# Patient Record
Sex: Female | Born: 1970 | Race: Black or African American | Hispanic: No | Marital: Single | Smoking: Former smoker
Health system: Southern US, Community
[De-identification: ages and names within clinical notes are randomized; demographics above are authoritative.]

## PROBLEM LIST (undated history)

## (undated) DIAGNOSIS — F1490 Cocaine use, unspecified, uncomplicated: Secondary | ICD-10-CM

## (undated) DIAGNOSIS — R569 Unspecified convulsions: Secondary | ICD-10-CM

## (undated) DIAGNOSIS — F3181 Bipolar II disorder: Secondary | ICD-10-CM

## (undated) DIAGNOSIS — T1491XA Suicide attempt, initial encounter: Secondary | ICD-10-CM

## (undated) DIAGNOSIS — F4310 Post-traumatic stress disorder, unspecified: Secondary | ICD-10-CM

## (undated) DIAGNOSIS — F32A Depression, unspecified: Secondary | ICD-10-CM

## (undated) HISTORY — DX: Post-traumatic stress disorder, unspecified: F43.10

## (undated) HISTORY — DX: Cocaine use, unspecified, uncomplicated: F14.90

## (undated) HISTORY — DX: Depression, unspecified: F32.A

---

## 1998-08-22 ENCOUNTER — Emergency Department (HOSPITAL_COMMUNITY): Admission: EM | Admit: 1998-08-22 | Discharge: 1998-08-22 | Payer: Self-pay | Admitting: Emergency Medicine

## 1998-08-22 ENCOUNTER — Encounter: Payer: Self-pay | Admitting: Emergency Medicine

## 1999-08-21 ENCOUNTER — Inpatient Hospital Stay (HOSPITAL_COMMUNITY): Admission: AD | Admit: 1999-08-21 | Discharge: 1999-08-21 | Payer: Self-pay | Admitting: Obstetrics

## 2001-03-19 ENCOUNTER — Encounter: Admission: RE | Admit: 2001-03-19 | Discharge: 2001-03-19 | Payer: Self-pay | Admitting: Family Medicine

## 2001-04-10 ENCOUNTER — Ambulatory Visit (HOSPITAL_COMMUNITY): Admission: RE | Admit: 2001-04-10 | Discharge: 2001-04-10 | Payer: Self-pay | Admitting: Family Medicine

## 2001-04-10 ENCOUNTER — Encounter: Admission: RE | Admit: 2001-04-10 | Discharge: 2001-04-10 | Payer: Self-pay | Admitting: *Deleted

## 2001-04-10 ENCOUNTER — Encounter: Payer: Self-pay | Admitting: *Deleted

## 2001-04-14 ENCOUNTER — Encounter: Admission: RE | Admit: 2001-04-14 | Discharge: 2001-04-14 | Payer: Self-pay | Admitting: Pediatrics

## 2001-04-20 ENCOUNTER — Inpatient Hospital Stay (HOSPITAL_COMMUNITY): Admission: AD | Admit: 2001-04-20 | Discharge: 2001-04-20 | Payer: Self-pay | Admitting: *Deleted

## 2001-05-05 ENCOUNTER — Encounter: Admission: RE | Admit: 2001-05-05 | Discharge: 2001-05-05 | Payer: Self-pay | Admitting: Family Medicine

## 2003-02-16 ENCOUNTER — Emergency Department (HOSPITAL_COMMUNITY): Admission: EM | Admit: 2003-02-16 | Discharge: 2003-02-17 | Payer: Self-pay | Admitting: *Deleted

## 2003-08-08 ENCOUNTER — Inpatient Hospital Stay (HOSPITAL_COMMUNITY): Admission: EM | Admit: 2003-08-08 | Discharge: 2003-08-16 | Payer: Self-pay | Admitting: Psychiatry

## 2003-10-07 ENCOUNTER — Emergency Department (HOSPITAL_COMMUNITY): Admission: EM | Admit: 2003-10-07 | Discharge: 2003-10-07 | Payer: Self-pay | Admitting: Emergency Medicine

## 2004-02-21 ENCOUNTER — Emergency Department (HOSPITAL_COMMUNITY): Admission: EM | Admit: 2004-02-21 | Discharge: 2004-02-21 | Payer: Self-pay | Admitting: Emergency Medicine

## 2005-01-08 ENCOUNTER — Emergency Department (HOSPITAL_COMMUNITY): Admission: EM | Admit: 2005-01-08 | Discharge: 2005-01-08 | Payer: Self-pay | Admitting: Emergency Medicine

## 2006-04-15 ENCOUNTER — Emergency Department (HOSPITAL_COMMUNITY): Admission: EM | Admit: 2006-04-15 | Discharge: 2006-04-15 | Payer: Self-pay | Admitting: Emergency Medicine

## 2007-08-04 ENCOUNTER — Emergency Department (HOSPITAL_COMMUNITY): Admission: EM | Admit: 2007-08-04 | Discharge: 2007-08-04 | Payer: Self-pay | Admitting: Emergency Medicine

## 2010-10-06 NOTE — H&P (Signed)
NAME:  Sylvia Arellano, Sylvia Arellano                           ACCOUNT NO.:  [id number]   MEDICAL RECORD NO.:  [id number]                   PATIENT TYPE:  IPS   LOCATION:  0306                                 FACILITY:  [facility]   PHYSICIAN:  Jeanice Lim, M.D.              DATE OF BIRTH:  10-Sep-1970   DATE OF ADMISSION:  08/08/2003  DATE OF DISCHARGE:                         PSYCHIATRIC ADMISSION ASSESSMENT   IDENTIFYING INFORMATION:  This is a voluntary admission.  This is a 40-year-  old single black female.  She presented at [facility]  yesterday with a plan to drink bleach or overdose.  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  She states she is using  about $300 a day.  She began sniffing cocaine about four years ago and then  smoking one year ago.  She has had prior treatment at [facility], which was  a facility for pregnant but addicted females.  She was admitted there  December 2003 from [place].  She stayed clean for about eight months  until her mother died in 16-Jan-2003 and she said she was clean one day  and then relapsed after being discharged.  Her first overdose was at age 44  and that is all she remembers at this point.   SOCIAL HISTORY:  She has had about one year of college.  She was employed  Radiographer, therapeutic.  Currently, she has a 59 year old daughter, who lives with her  father, a 58-year-old son, who lives with his father.   FAMILY HISTORY:  She states that her mother had depression and anxiety.   ALCOHOL/DRUG HISTORY:  She denies alcohol, although she does smoke  cigarettes and, of course, has been using crack.   PRIMARY CARE PHYSICIAN:  None.   MEDICAL PROBLEMS:  She has no known problems, although she is quite  concerned about having perhaps caught an STD or HIV.  She would like to be  checked for that.   CURRENT MEDICATIONS:  None.   ALLERGIES:  None.   PHYSICAL EXAMINATION:  GENERAL:  A healthy young, black  female who appeared  her stated age.  HEENT:  Increased ear wax; otherwise was within normal limits.  LUNGS:  Clear.  HEART:  Regular rate and rhythm without murmurs, rubs, or gallops.  ABDOMEN:  Soft with no mass, megaly or tenderness.  MUSCULOSKELETAL:  No clubbing, cyanosis, or edema.  NEURO:  Cranial nerves 2-12 were grossly intact.   MENTAL STATUS EXAM:  Alert and oriented x 3.  She was well-groomed and  casually dressed.  Her speech was not pressured.  Her mood was somewhat  depressed and anxious.  Appropriate to the situation.  Her affect was  congruent.  Her thought processes were clear, rational and goal-oriented.  She was not issuing any signs of paranoia or delusions and none was detected  in her  speech.  Concentration and memory were intact.  Judgment and insight  were intact albeit not good.  Intelligence is average.   DIAGNOSES:   AXIS I:  1. Substance-induced mood disorder.  2. Tobacco use; rule out dependence.   AXIS II:  Deferred.   AXIS III:  Rule out sexually transmitted diseases.   AXIS IV:  Problems with primary support group, problems related to social  environment, occupational problems, housing problem, economic problem and  other psychosocial problems, unresolved grief.   AXIS V:  30.   PLAN:  She is admitted to provide safety, to help her through withdrawal and  to establish antidepressant therapy.  We will also have casemanagement  explore further long-term drug rehab for her.     Vic Ripper, P.A.-C.               Jeanice Lim, M.D.    MD/MEDQ  D:  08/08/2003  T:  08/08/2003  Job:  [id number]

## 2010-10-06 NOTE — Discharge Summary (Signed)
NAME:  Sylvia Arellano, Sylvia Arellano                           ACCOUNT NO.:  [id number]   MEDICAL RECORD NO.:  [id number]                   PATIENT TYPE:  IPS   LOCATION:  0306                                 FACILITY[facility]  [facility]   PHYSICIAN:  Jeanice Lim, M.D.              DATE OF BIRTH:  1970-08-31   DATE OF ADMISSION:  08/08/2003  DATE OF DISCHARGE:  08/16/2003                                 DISCHARGE SUMMARY   IDENTIFYING DATA:  This is a 40 year old, single, African-American female  presenting to [facility] planning to drink bleach  and overdose.  The patient has been prostituting of self for crack, using  $300.00 a day.  She began sniffing cocaine four years ago and smoking one  year ago.  The patient stayed clean for about eight months until her mother  died in 09-03-2004and she relapsed after this.  First overdose at age 68.   MEDICATIONS:  None.   ALLERGIES:  None.   PHYSICAL EXAMINATION:  Within normal limits.  NEUROLOGIC:  Nonfocal.   ROUTINE ADMISSION LABORATORIES:  Within normal limits.   MENTAL STATUS EXAM:  Alert and oriented, well-groomed and casually dressed,  speech not pressured, mood depressed, anxious, thought process goal-  directed, thought content negative for dangerous ideation and psychotic  symptoms.  Cognitively intact. Judgment and insight fair.   ADMITTING DIAGNOSES:   AXIS I:  1. Substance induced mood disorder.  2. Nicotine dependence.   AXIS II:  Deferred.   AXIS III:  Rule out sexually transmitted disease.   AXIS IV:  1. Moderate problems with primary support group and problems related to     social environment.  2. Occupational problems.  3. Housing problems.  4. Economic problems, unresolved in degree.   AXIS V:  30/55 to 60.   The patient was admitted and ordered routine p.r.n. medication and underwent  monitoring.  She was encouraged to participate in individual, group and  milieu therapy.  The patient had a  sexually transmitted disease workup with  chlamydia and gonorrhea, RPR, HIV.  She was started on Wellbutrin for her  antidepressive symptoms and Neurontin as well as Symmetrel for cocaine  cravings.  Seroquel has been used to restore sleep; Cipro used to treat UTI.  The patient reported a positive response to the medication changes.  She  tolerated the medication changes well.  The patient was also detoxed with  Librium and optimized on medications.  The patient reported a gradual slow  positive response to medication changes and reported motivation to be  discharged to a substance abuse treatment program.  She was optimized on  Zyprexa, used Vistaril for p.r.n. anxiety and reported a positive response  to clinical intervention.   CONDITION ON DISCHARGE:  Markedly improved; mood stable; less anxious; less  depressed; motivated to complete residential substance abuse program.   She was given medication education  and discharged on Paxil 20 mg q.a.m.;  Pyridium 100 mg t.i.d. p.r.n.; Vistaril 50 mg two q.8 p.r.n. anxiety;  Wellbutrin XL 150 mg q.a.m.; Symmetrel 100 mg b.i.d.; Neurontin 300 mg  t.i.d. and three q.h.s.; Zyprexa 15 mg q.h.s.; Zyprexa 2.5 q.4 p.r.n.  agitation.  The patient was discharged to follow-up at residential treatment  program.   DISCHARGE DIAGNOSES:   AXIS I:  1. Substance induced mood disorder.  2. Nicotine dependence.   AXIS II:  Deferred.   AXIS III:  Rule out sexually transmitted disease.   AXIS IV:  1. Moderate problems with primary support group and problems related to     social environment.  2. Occupational problems.  3. Housing problems.  4. Economic problems, unresolved in degree.   AXIS V:  Global assessment of functioning of 55-60.                                               Jeanice Lim, M.D.    JEM/MEDQ  D:  09/15/2003  T:  09/17/2003  Job:  [id number]

## 2011-02-12 LAB — URINALYSIS, ROUTINE W REFLEX MICROSCOPIC
Bilirubin Urine: NEGATIVE
Glucose, UA: NEGATIVE
Hgb urine dipstick: NEGATIVE
Ketones, ur: NEGATIVE
Nitrite: NEGATIVE
Protein, ur: NEGATIVE
Specific Gravity, Urine: 1.024
Urobilinogen, UA: 1
pH: 7

## 2011-02-12 LAB — WET PREP, GENITAL
Trich, Wet Prep: NONE SEEN
WBC, Wet Prep HPF POC: NONE SEEN
Yeast Wet Prep HPF POC: NONE SEEN

## 2011-02-12 LAB — GC/CHLAMYDIA PROBE AMP, GENITAL
Chlamydia, DNA Probe: NEGATIVE
GC Probe Amp, Genital: NEGATIVE

## 2011-02-12 LAB — PREGNANCY, URINE: Preg Test, Ur: NEGATIVE

## 2011-02-12 LAB — URINE MICROSCOPIC-ADD ON

## 2012-03-30 ENCOUNTER — Emergency Department (HOSPITAL_COMMUNITY)
Admission: EM | Admit: 2012-03-30 | Discharge: 2012-03-31 | Disposition: A | Discharge status: Home / Self Care | Payer: Self-pay | Attending: Emergency Medicine | Admitting: Emergency Medicine

## 2012-03-30 ENCOUNTER — Encounter (HOSPITAL_COMMUNITY): Payer: Self-pay | Admitting: Emergency Medicine

## 2012-03-30 DIAGNOSIS — T50992A Poisoning by other drugs, medicaments and biological substances, intentional self-harm, initial encounter: Secondary | ICD-10-CM | POA: Insufficient documentation

## 2012-03-30 DIAGNOSIS — F3189 Other bipolar disorder: Secondary | ICD-10-CM | POA: Insufficient documentation

## 2012-03-30 DIAGNOSIS — T50901A Poisoning by unspecified drugs, medicaments and biological substances, accidental (unintentional), initial encounter: Secondary | ICD-10-CM | POA: Insufficient documentation

## 2012-03-30 DIAGNOSIS — Z8669 Personal history of other diseases of the nervous system and sense organs: Secondary | ICD-10-CM | POA: Insufficient documentation

## 2012-03-30 HISTORY — DX: Bipolar II disorder: F31.81

## 2012-03-30 HISTORY — DX: Unspecified convulsions: R56.9

## 2012-03-30 HISTORY — DX: Suicide attempt, initial encounter: T14.91XA

## 2012-03-30 LAB — COMPREHENSIVE METABOLIC PANEL
ALT: 15 U/L (ref 0–35)
AST: 30 U/L (ref 0–37)
Albumin: 3.9 g/dL (ref 3.5–5.2)
Calcium: 9.4 mg/dL (ref 8.4–10.5)
GFR calc Af Amer: 75 mL/min — ABNORMAL LOW (ref >90)
Sodium: 139 mEq/L (ref 135–145)
Total Protein: 8.6 g/dL — ABNORMAL HIGH (ref 6.0–8.3)

## 2012-03-30 LAB — SALICYLATE LEVEL: Salicylate Lvl: <2 mg/dL — ABNORMAL LOW (ref 2.8–20.0)

## 2012-03-30 LAB — CBC
MCH: 26.8 pg (ref 26.0–34.0)
MCHC: 34.1 g/dL (ref 30.0–36.0)
Platelets: 291 10*3/uL (ref 150–400)
RDW: 15.8 % — ABNORMAL HIGH (ref 11.5–15.5)

## 2012-03-30 LAB — RAPID URINE DRUG SCREEN, HOSP PERFORMED
Amphetamines: POSITIVE — AB
Benzodiazepines: NOT DETECTED
Opiates: NOT DETECTED

## 2012-03-30 MED ORDER — NICOTINE 21 MG/24HR TD PT24: 21.0000 mg | MEDICATED_PATCH | Freq: Every day | TRANSDERMAL | Status: DC

## 2012-03-30 MED ORDER — ALUM & MAG HYDROXIDE-SIMETH 200-200-20 MG/5ML PO SUSP: 30.0000 mL | ORAL | Status: DC | PRN

## 2012-03-30 MED ORDER — ONDANSETRON HCL 4 MG PO TABS: 4.0000 mg | ORAL_TABLET | Freq: Three times a day (TID) | ORAL | Status: DC | PRN

## 2012-03-30 MED ORDER — ZOLPIDEM TARTRATE 5 MG PO TABS: 5.0000 mg | ORAL_TABLET | Freq: Every evening | ORAL | Status: DC | PRN

## 2012-03-30 NOTE — ED Provider Notes (Signed)
History     CSN: [id number]  Arrival date & time 03/30/12  2127   None     Chief Complaint  Patient presents with  . Drug Overdose    (Consider location/radiation/quality/duration/timing/severity/associated sxs/prior treatment) HPI Patient overdosed with multiple medications unknown time today. She overdosed as she has had stresses with family members. She presently feels confused. No treatment prior to coming here the patient was evaluated at [facility] emergency department sent here for further evaluation Past Medical History  Diagnosis Date  . Bipolar 2 disorder   . Suicide attempt   . Seizures     No past surgical history on file.  No family history on file.  History  Substance Use Topics  . Smoking status: Not on file  . Smokeless tobacco: Not on file  . Alcohol Use: Yes   positive smoker occasional alcohol admits to cocaine use  OB History    Grav Para Term Preterm Abortions TAB SAB Ect Mult Living                  Review of Systems  Psychiatric/Behavioral: Positive for confusion and dysphoric mood.  All other systems reviewed and are negative.    Allergies  Review of patient's allergies indicates no known allergies.  Home Medications  No current outpatient prescriptions on file.  BP 120/98  Pulse 95  Temp 98.9 F (37.2 C) (Oral)  Resp 18  SpO2 100%  Physical Exam  Nursing note and vitals reviewed. Constitutional: She is oriented to person, place, and time. She appears well-developed and well-nourished.  HENT:  Head: Normocephalic and atraumatic.  Eyes: Conjunctivae normal are normal. Pupils are equal, round, and reactive to light.  Neck: Neck supple. No tracheal deviation present. No thyromegaly present.  Cardiovascular: Normal rate and regular rhythm.   No murmur heard. Pulmonary/Chest: Effort normal and breath sounds normal.  Abdominal: Soft. Bowel sounds are normal. She exhibits no distension. There is no tenderness.  Musculoskeletal:  Normal range of motion. She exhibits no edema and no tenderness.  Neurological: She is alert and oriented to person, place, and time. Coordination normal.       Gait normal  Skin: Skin is warm and dry. No rash noted.  Psychiatric: She has a normal mood and affect.    ED Course  Procedures (including critical care time)  Labs Reviewed  CBC - Abnormal; Notable for the following:    Hemoglobin 11.8 (*)     HCT 34.6 (*)     RDW 15.8 (*)     All other components within normal limits  URINE RAPID DRUG SCREEN (HOSP PERFORMED) - Abnormal; Notable for the following:    Cocaine POSITIVE (*)     Amphetamines POSITIVE (*)     All other components within normal limits  POCT PREGNANCY, URINE  COMPREHENSIVE METABOLIC PANEL  ETHANOL  ACETAMINOPHEN LEVEL  SALICYLATE LEVEL   No results found.   No diagnosis found.   Date: 03/30/2012  Rate: 90  Rhythm: normal sinus rhythm  QRS Axis: normal  Intervals: normal  ST/T Wave abnormalities: nonspecific T wave changes  Conduction Disutrbances:none  Narrative Interpretation:   Old EKG Reviewed: No significant change or Oct 07 2003 as interpreted by me Results for orders placed during the hospital encounter of 03/30/12  CBC      Component Value Range   WBC 6.6  4.0 - 10.5 K/uL   RBC 4.41  3.87 - 5.11 MIL/uL   Hemoglobin 11.8 (*) 12.0 - 15.0 g/dL  HCT 34.6 (*) 36.0 - 46.0 %   MCV 78.5  78.0 - 100.0 fL   MCH 26.8  26.0 - 34.0 pg   MCHC 34.1  30.0 - 36.0 g/dL   RDW 16.1 (*) 09.6 - 04.5 %   Platelets 291  150 - 400 K/uL  COMPREHENSIVE METABOLIC PANEL      Component Value Range   Sodium 139  135 - 145 mEq/L   Potassium 4.2  3.5 - 5.1 mEq/L   Chloride 102  96 - 112 mEq/L   CO2 23  19 - 32 mEq/L   Glucose, Bld 91  70 - 99 mg/dL   BUN 15  6 - 23 mg/dL   Creatinine, Ser 4.09  0.50 - 1.10 mg/dL   Calcium 9.4  8.4 - 81.1 mg/dL   Total Protein 8.6 (*) 6.0 - 8.3 g/dL   Albumin 3.9  3.5 - 5.2 g/dL   AST 30  0 - 37 U/L   ALT 15  0 - 35 U/L    Alkaline Phosphatase 73  39 - 117 U/L   Total Bilirubin 0.2 (*) 0.3 - 1.2 mg/dL   GFR calc non Af Amer 65 (*) >90 mL/min   GFR calc Af Amer 75 (*) >90 mL/min  ETHANOL      Component Value Range   Alcohol, Ethyl (B) 15 (*) 0 - 11 mg/dL  ACETAMINOPHEN LEVEL      Component Value Range   Acetaminophen (Tylenol), Serum <15.0  10 - 30 ug/mL  SALICYLATE LEVEL      Component Value Range   Salicylate Lvl <2.0 (*) 2.8 - 20.0 mg/dL  URINE RAPID DRUG SCREEN (HOSP PERFORMED)      Component Value Range   Opiates NONE DETECTED  NONE DETECTED   Cocaine POSITIVE (*) NONE DETECTED   Benzodiazepines NONE DETECTED  NONE DETECTED   Amphetamines POSITIVE (*) NONE DETECTED   Tetrahydrocannabinol NONE DETECTED  NONE DETECTED   Barbiturates NONE DETECTED  NONE DETECTED  POCT PREGNANCY, URINE      Component Value Range   Preg Test, Ur NEGATIVE  NEGATIVE   No results found.   MDM  Plan psychiatric evaluation. Patient cooperative and agrees to psychiatric evaluation and psychiatric hospitalization at present Diagnosis drug overdose        Doug Sou, MD 03/30/12 2350

## 2012-03-30 NOTE — ED Notes (Signed)
ZOX:[id number]<AV> Expected date:03/30/12<BR> Expected time: 8:57 PM<BR> Means of arrival:Ambulance<BR> Comments:<BR> RM 6: OD pills, presented to [facility] w/ AMS

## 2012-03-30 NOTE — ED Notes (Addendum)
Pt comes from [place] where she tried to check herself in for taking a bottle Trazadone, Prozac, a handful of Neurontin, and 'another med.' Doesn't admit to trying to harm herself but made statements to EMS like "i just dont want to feel this way anymore.' Hx of bipolar disorder. Goes to outpt at [facility].

## 2012-03-31 NOTE — ED Provider Notes (Signed)
2:27 AM PT awake and alert, not suicidal. Telepsych consult obtained and reviewed dated 03-31-12.  Dr Jacky Kindle recommends OK for discharge home. He recommends outpatient drug and alcohol program and also [facility] follow up. ACT provided referrals.   Sunnie Nielsen, MD 03/31/12 [phone]

## 2014-07-09 ENCOUNTER — Emergency Department (HOSPITAL_COMMUNITY): Payer: Self-pay

## 2014-07-09 ENCOUNTER — Inpatient Hospital Stay (HOSPITAL_COMMUNITY): Payer: Self-pay

## 2014-07-09 ENCOUNTER — Inpatient Hospital Stay (HOSPITAL_COMMUNITY)
Admission: EM | Admit: 2014-07-09 | Discharge: 2014-07-12 | DRG: 078 | Discharge status: Against Medical Advice | Payer: Self-pay | Attending: Internal Medicine | Admitting: Internal Medicine

## 2014-07-09 ENCOUNTER — Encounter (HOSPITAL_COMMUNITY): Payer: Self-pay | Admitting: Emergency Medicine

## 2014-07-09 ENCOUNTER — Observation Stay (HOSPITAL_COMMUNITY): Payer: Self-pay

## 2014-07-09 DIAGNOSIS — G459 Transient cerebral ischemic attack, unspecified: Secondary | ICD-10-CM

## 2014-07-09 DIAGNOSIS — I160 Hypertensive urgency: Secondary | ICD-10-CM | POA: Diagnosis present

## 2014-07-09 DIAGNOSIS — Y900 Blood alcohol level of less than 20 mg/100 ml: Secondary | ICD-10-CM | POA: Diagnosis present

## 2014-07-09 DIAGNOSIS — Z87898 Personal history of other specified conditions: Secondary | ICD-10-CM

## 2014-07-09 DIAGNOSIS — F1410 Cocaine abuse, uncomplicated: Secondary | ICD-10-CM

## 2014-07-09 DIAGNOSIS — R55 Syncope and collapse: Secondary | ICD-10-CM | POA: Diagnosis present

## 2014-07-09 DIAGNOSIS — G40909 Epilepsy, unspecified, not intractable, without status epilepticus: Secondary | ICD-10-CM | POA: Diagnosis present

## 2014-07-09 DIAGNOSIS — R059 Cough, unspecified: Secondary | ICD-10-CM

## 2014-07-09 DIAGNOSIS — I959 Hypotension, unspecified: Secondary | ICD-10-CM | POA: Diagnosis not present

## 2014-07-09 DIAGNOSIS — F17210 Nicotine dependence, cigarettes, uncomplicated: Secondary | ICD-10-CM | POA: Diagnosis present

## 2014-07-09 DIAGNOSIS — F1490 Cocaine use, unspecified, uncomplicated: Secondary | ICD-10-CM | POA: Insufficient documentation

## 2014-07-09 DIAGNOSIS — E876 Hypokalemia: Secondary | ICD-10-CM | POA: Diagnosis present

## 2014-07-09 DIAGNOSIS — F1010 Alcohol abuse, uncomplicated: Secondary | ICD-10-CM | POA: Diagnosis present

## 2014-07-09 DIAGNOSIS — R05 Cough: Secondary | ICD-10-CM

## 2014-07-09 DIAGNOSIS — F1091 Alcohol use, unspecified, in remission: Secondary | ICD-10-CM | POA: Diagnosis present

## 2014-07-09 DIAGNOSIS — Z79899 Other long term (current) drug therapy: Secondary | ICD-10-CM

## 2014-07-09 DIAGNOSIS — R4701 Aphasia: Secondary | ICD-10-CM | POA: Diagnosis present

## 2014-07-09 DIAGNOSIS — F3181 Bipolar II disorder: Secondary | ICD-10-CM | POA: Diagnosis present

## 2014-07-09 DIAGNOSIS — I639 Cerebral infarction, unspecified: Secondary | ICD-10-CM | POA: Insufficient documentation

## 2014-07-09 DIAGNOSIS — F17201 Nicotine dependence, unspecified, in remission: Secondary | ICD-10-CM | POA: Diagnosis present

## 2014-07-09 DIAGNOSIS — I10 Essential (primary) hypertension: Secondary | ICD-10-CM | POA: Diagnosis present

## 2014-07-09 DIAGNOSIS — I674 Hypertensive encephalopathy: Principal | ICD-10-CM | POA: Insufficient documentation

## 2014-07-09 DIAGNOSIS — Z8669 Personal history of other diseases of the nervous system and sense organs: Secondary | ICD-10-CM

## 2014-07-09 DIAGNOSIS — F419 Anxiety disorder, unspecified: Secondary | ICD-10-CM | POA: Diagnosis present

## 2014-07-09 DIAGNOSIS — Z720 Tobacco use: Secondary | ICD-10-CM | POA: Diagnosis present

## 2014-07-09 HISTORY — DX: Cocaine abuse, uncomplicated: F14.10

## 2014-07-09 LAB — I-STAT CHEM 8, ED
BUN: 10 mg/dL (ref 6–23)
CALCIUM ION: 1.05 mmol/L — AB (ref 1.12–1.23)
CHLORIDE: 100 mmol/L (ref 96–112)
CREATININE: 1 mg/dL (ref 0.50–1.10)
Glucose, Bld: 175 mg/dL — ABNORMAL HIGH (ref 70–99)
HCT: 41 % (ref 36.0–46.0)
Hemoglobin: 13.9 g/dL (ref 12.0–15.0)
Potassium: 3.4 mmol/L — ABNORMAL LOW (ref 3.5–5.1)
SODIUM: 135 mmol/L (ref 135–145)
TCO2: 14 mmol/L (ref 0–100)

## 2014-07-09 LAB — I-STAT TROPONIN, ED: TROPONIN I, POC: 0 ng/mL (ref 0.00–0.08)

## 2014-07-09 LAB — COMPREHENSIVE METABOLIC PANEL
ALK PHOS: 71 U/L (ref 39–117)
ALT: 20 U/L (ref 0–35)
ANION GAP: 17 — AB (ref 5–15)
AST: 40 U/L — ABNORMAL HIGH (ref 0–37)
Albumin: 4.4 g/dL (ref 3.5–5.2)
BILIRUBIN TOTAL: 0.6 mg/dL (ref 0.3–1.2)
BUN: 10 mg/dL (ref 6–23)
CALCIUM: 9.2 mg/dL (ref 8.4–10.5)
CO2: 18 mmol/L — AB (ref 19–32)
Chloride: 99 mmol/L (ref 96–112)
Creatinine, Ser: 1.13 mg/dL — ABNORMAL HIGH (ref 0.50–1.10)
GFR calc Af Amer: 69 mL/min — ABNORMAL LOW (ref >90)
GFR calc non Af Amer: 60 mL/min — ABNORMAL LOW (ref >90)
GLUCOSE: 171 mg/dL — AB (ref 70–99)
Potassium: 3.4 mmol/L — ABNORMAL LOW (ref 3.5–5.1)
SODIUM: 134 mmol/L — AB (ref 135–145)
Total Protein: 8.2 g/dL (ref 6.0–8.3)

## 2014-07-09 LAB — DIFFERENTIAL
BASOS PCT: 1 % (ref 0–1)
Basophils Absolute: 0 10*3/uL (ref 0.0–0.1)
EOS ABS: 0 10*3/uL (ref 0.0–0.7)
EOS PCT: 0 % (ref 0–5)
Lymphocytes Relative: 46 % (ref 12–46)
Lymphs Abs: 2.5 10*3/uL (ref 0.7–4.0)
MONO ABS: 0.7 10*3/uL (ref 0.1–1.0)
MONOS PCT: 12 % (ref 3–12)
Neutro Abs: 2.3 10*3/uL (ref 1.7–7.7)
Neutrophils Relative %: 41 % — ABNORMAL LOW (ref 43–77)

## 2014-07-09 LAB — URINALYSIS, ROUTINE W REFLEX MICROSCOPIC
Bilirubin Urine: NEGATIVE
Glucose, UA: NEGATIVE mg/dL
HGB URINE DIPSTICK: NEGATIVE
Ketones, ur: NEGATIVE mg/dL
Leukocytes, UA: NEGATIVE
Nitrite: NEGATIVE
PROTEIN: 30 mg/dL — AB
SPECIFIC GRAVITY, URINE: 1.019 (ref 1.005–1.030)
UROBILINOGEN UA: 0.2 mg/dL (ref 0.0–1.0)
pH: 6 (ref 5.0–8.0)

## 2014-07-09 LAB — APTT: APTT: 30 s (ref 24–37)

## 2014-07-09 LAB — RAPID URINE DRUG SCREEN, HOSP PERFORMED
Amphetamines: NOT DETECTED
BARBITURATES: NOT DETECTED
Benzodiazepines: NOT DETECTED
Cocaine: POSITIVE — AB
Opiates: NOT DETECTED
TETRAHYDROCANNABINOL: NOT DETECTED

## 2014-07-09 LAB — PROTIME-INR
INR: 1.2 (ref 0.00–1.49)
PROTHROMBIN TIME: 15.3 s — AB (ref 11.6–15.2)

## 2014-07-09 LAB — CBC
HEMATOCRIT: 35.5 % — AB (ref 36.0–46.0)
HEMOGLOBIN: 11.8 g/dL — AB (ref 12.0–15.0)
MCH: 27.3 pg (ref 26.0–34.0)
MCHC: 33.2 g/dL (ref 30.0–36.0)
MCV: 82.2 fL (ref 78.0–100.0)
Platelets: 282 10*3/uL (ref 150–400)
RBC: 4.32 MIL/uL (ref 3.87–5.11)
RDW: 15.4 % (ref 11.5–15.5)
WBC: 5.6 10*3/uL (ref 4.0–10.5)

## 2014-07-09 LAB — URINE MICROSCOPIC-ADD ON

## 2014-07-09 LAB — ETHANOL: Alcohol, Ethyl (B): 11 mg/dL — ABNORMAL HIGH (ref 0–9)

## 2014-07-09 MED ORDER — THIAMINE HCL 100 MG/ML IJ SOLN
100.0000 mg | Freq: Every day | INTRAMUSCULAR | Status: DC
  Filled 2014-07-09: qty 2

## 2014-07-09 MED ORDER — POTASSIUM CHLORIDE CRYS ER 20 MEQ PO TBCR
40.0000 meq | EXTENDED_RELEASE_TABLET | Freq: Once | ORAL | Status: AC
  Administered 2014-07-09: 40 meq via ORAL
  Filled 2014-07-09: qty 2

## 2014-07-09 MED ORDER — FOLIC ACID 1 MG PO TABS
1.0000 mg | ORAL_TABLET | Freq: Every day | ORAL | Status: DC
  Administered 2014-07-09 – 2014-07-12 (×4): 1 mg via ORAL
  Filled 2014-07-09 (×4): qty 1

## 2014-07-09 MED ORDER — LORAZEPAM 1 MG PO TABS: 0.0000 mg | ORAL_TABLET | Freq: Two times a day (BID) | ORAL | Status: DC

## 2014-07-09 MED ORDER — SENNOSIDES-DOCUSATE SODIUM 8.6-50 MG PO TABS: 1.0000 | ORAL_TABLET | Freq: Every evening | ORAL | Status: DC | PRN

## 2014-07-09 MED ORDER — ASPIRIN 325 MG PO TABS
325.0000 mg | ORAL_TABLET | Freq: Every day | ORAL | Status: DC
  Administered 2014-07-09 – 2014-07-12 (×4): 325 mg via ORAL
  Filled 2014-07-09 (×4): qty 1

## 2014-07-09 MED ORDER — ATORVASTATIN CALCIUM 20 MG PO TABS
20.0000 mg | ORAL_TABLET | Freq: Every day | ORAL | Status: DC
  Administered 2014-07-09 – 2014-07-11 (×3): 20 mg via ORAL
  Filled 2014-07-09 (×2): qty 1
  Filled 2014-07-09 (×2): qty 2
  Filled 2014-07-09 (×2): qty 1
  Filled 2014-07-09: qty 2

## 2014-07-09 MED ORDER — LORAZEPAM 1 MG PO TABS
0.0000 mg | ORAL_TABLET | Freq: Four times a day (QID) | ORAL | Status: AC
  Administered 2014-07-11: 1 mg via ORAL
  Filled 2014-07-09: qty 1

## 2014-07-09 MED ORDER — VITAMIN B-1 100 MG PO TABS
100.0000 mg | ORAL_TABLET | Freq: Every day | ORAL | Status: DC
  Administered 2014-07-09 – 2014-07-12 (×4): 100 mg via ORAL
  Filled 2014-07-09 (×4): qty 1

## 2014-07-09 MED ORDER — LORAZEPAM 1 MG PO TABS: 1.0000 mg | ORAL_TABLET | Freq: Four times a day (QID) | ORAL | Status: DC | PRN

## 2014-07-09 MED ORDER — ASPIRIN 300 MG RE SUPP: 300.0000 mg | Freq: Every day | RECTAL | Status: DC

## 2014-07-09 MED ORDER — HYDRALAZINE HCL 20 MG/ML IJ SOLN: 10.0000 mg | Freq: Four times a day (QID) | INTRAMUSCULAR | Status: DC | PRN

## 2014-07-09 MED ORDER — ADULT MULTIVITAMIN W/MINERALS CH
1.0000 | ORAL_TABLET | Freq: Every day | ORAL | Status: DC
  Administered 2014-07-09 – 2014-07-12 (×4): 1 via ORAL
  Filled 2014-07-09 (×4): qty 1

## 2014-07-09 MED ORDER — LORAZEPAM 2 MG/ML IJ SOLN: 1.0000 mg | Freq: Four times a day (QID) | INTRAMUSCULAR | Status: DC | PRN

## 2014-07-09 MED ORDER — HEPARIN SODIUM (PORCINE) 5000 UNIT/ML IJ SOLN
5000.0000 [IU] | Freq: Three times a day (TID) | INTRAMUSCULAR | Status: DC
  Administered 2014-07-09 – 2014-07-12 (×8): 5000 [IU] via SUBCUTANEOUS
  Filled 2014-07-09 (×8): qty 1

## 2014-07-09 MED ORDER — STROKE: EARLY STAGES OF RECOVERY BOOK
Freq: Once | Status: AC
  Administered 2014-07-09: 19:00:00

## 2014-07-09 NOTE — H&P (Signed)
[facility] History and Physical                                                                                    Sylvia Arellano, is a 44 y.o. female  MRN: [id number]   DOB - Apr 19, 1971  Admit Date - 07/09/2014  Outpatient Primary MD for the patient is No primary care provider on file.  With History of -  Past Medical History  Diagnosis Date  . Bipolar 2 disorder   . Suicide attempt   . Seizures       History reviewed. No pertinent past surgical history.  in for   Chief Complaint  Patient presents with  . Code Stroke     HPI Sylvia Arellano  is a 44 y.o. female, the past medical history of bipolar affective disorder as well as seizures. Brought to the ER due to acute complaints of new onset slurred speech associated with dizziness and apparent confusion. According to the documentation from the neurologist the patient developed the symptoms around 11:15 AM today. Patient has no prior history of stroke or TIA and has not required antiplatelet therapy for any diagnoses. When she presented to the emergency department her blood pressure was markedly elevated at 280/120. Her blood pressure improved after arrival to the ER to 140/98 without any intervention or medication administration. CT of the head revealed no acute intra-or no abnormality. Her NIH stroke score was 0. Mother time the neurologist to evaluate the patient her mental status changes had resolved including her slurred speech and she was noted to have no focal weakness or numbness. She later admitted to the EDP that she had been using cocaine today prior to coming to the ER. She also admitted to daily use of alcohol at least 3-4 beers per day.  Upon my evaluation, the nurse reported that the patient's slurred speech and difficulty speaking had returned. When I evaluated the patient she was having some difficulty with word finding and apparent mild expressive aphasia and also perseveration some of her words. MRI/MRA head has  been ordered stat. Patient has passed her swallowing evaluation. She confirms with me her regular use of cocaine and alcohol. 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 She states she is attempting to apply for disability.  Review of Systems   In addition to the HPI above,  No Fever-chills, myalgias or other constitutional symptoms No Headache, changes with Vision or hearing, new weakness, tingling, numbness in any extremity; new onset dizziness with difficulty speaking and associated problems with word finding and perseveration of some words No problems swallowing food or Liquids, indigestion/reflux No Chest pain, Cough or Shortness of Breath, palpitations, orthopnea or DOE No Abdominal pain, N/V; no melena or hematochezia, no dark tarry stools, Bowel movements are regular, No dysuria, hematuria or flank pain No new skin rashes, lesions, masses or bruises, No new joints pains-aches No recent weight gain or loss No polyuria, polydypsia or polyphagia,  *A full 10 point Review of Systems was done, except as stated above, all other Review of Systems were  negative.  Social History History  Substance Use Topics  . Smoking status: Current Every Day Smoker -- 0.50 packs/day  . Smokeless tobacco: None  . Alcohol Use: Yes-increased frequency over the past month, currently admits to at least 3-4 beers per day       Cocaine use:                              Admits to regular use of inhaled cocaine for several years and today tried crack cocaine         Family History Mother died age 42 from an MI  Prior to Admission medications   Medication Sig Start Date End Date Taking? Authorizing Provider  divalproex (DEPAKOTE ER) 500 MG 24 hr tablet Take 1,000 mg by mouth at bedtime.   Yes Historical Provider, MD  FLUoxetine (PROZAC) 20 MG capsule Take 20 mg by mouth 2 (two) times daily.   Yes  Historical Provider, MD  gabapentin (NEURONTIN) 400 MG capsule Take 1,200 mg by mouth 3 (three) times daily.   Yes Historical Provider, MD  HYDROXYZINE PAMOATE PO Take 100 mg by mouth 2 (two) times daily.    Yes Historical Provider, MD    No Known Allergies  Physical Exam  Vitals  Temperature 98.6 F (37 C).   General:  In no acute distress, appears healthy and well nourished  Psych:  Normal affect, Denies Suicidal or Homicidal ideations, Awake Alert, Oriented X 3. Speech and thought patterns are difficult to clearly elucidate giving recurrence of mild slurred speech associated with apparent aphasia and perseverated words, no apparent short term memory deficits  Neuro:   No focal neurological deficits, CN II through XII intact except for issues regarding speech difficulties as noted above, Strength 5/5 all 4 extremities, Sensation intact all 4 extremities.  ENT:  Ears and Eyes appear Normal, Conjunctivae clear, PER. Moist oral mucosa without erythema or exudates.  Neck:  Supple, No lymphadenopathy appreciated  Respiratory:  Symmetrical chest wall movement, Good air movement bilaterally, CTAB. Room Air  Cardiac:  RRR, No Murmurs, no LE edema noted, no JVD, No carotid bruits, peripheral pulses palpable at 2+  Abdomen:  Positive bowel sounds, Soft, Non tender, Non distended,  No masses appreciated, no obvious hepatosplenomegaly  Skin:  No Cyanosis, Normal Skin Turgor, No Skin Rash or Bruise.  Extremities: Symmetrical without obvious trauma or injury,  no effusions.  Data Review  CBC  Recent Labs Lab 07/09/14 1212 07/09/14 1224  WBC 5.6  --   HGB 11.8* 13.9  HCT 35.5* 41.0  PLT 282  --   MCV 82.2  --   MCH 27.3  --   MCHC 33.2  --   RDW 15.4  --   LYMPHSABS 2.5  --   MONOABS 0.7  --   EOSABS 0.0  --   BASOSABS 0.0  --     Chemistries   Recent Labs Lab 07/09/14 1212 07/09/14 1224  NA 134* 135  K 3.4* 3.4*  CL 99 100  CO2 18*  --   GLUCOSE 171* 175*  BUN  10 10  CREATININE 1.13* 1.00  CALCIUM 9.2  --   AST 40*  --   ALT 20  --   ALKPHOS 71  --   BILITOT 0.6  --     CrCl cannot be calculated (Unknown ideal weight.).  No results for input(s): TSH, T4TOTAL, T3FREE, THYROIDAB in the last 72 hours.  Invalid input(s): FREET3  Coagulation profile  Recent Labs Lab 07/09/14 1212  INR 1.20    No results for input(s): DDIMER in the last 72 hours.  Cardiac Enzymes No results for input(s): CKMB, TROPONINI, MYOGLOBIN in the last 168 hours.  Invalid input(s): CK  Invalid input(s): POCBNP  Urinalysis    Component Value Date/Time   COLORURINE YELLOW 08/04/2007 1428   APPEARANCEUR CLEAR 08/04/2007 1428   LABSPEC 1.024 08/04/2007 1428   PHURINE 7.0 08/04/2007 1428   GLUCOSEU NEGATIVE 08/04/2007 1428   HGBUR NEGATIVE 08/04/2007 1428   BILIRUBINUR NEGATIVE 08/04/2007 1428   KETONESUR NEGATIVE 08/04/2007 1428   PROTEINUR NEGATIVE 08/04/2007 1428   UROBILINOGEN 1.0 08/04/2007 1428   NITRITE NEGATIVE 08/04/2007 1428   LEUKOCYTESUR SMALL* 08/04/2007 1428    Imaging results:   Ct Head Wo Contrast  07/09/2014   CLINICAL DATA:  Acute onset slurred speech and confusion; dizziness  EXAM: CT HEAD WITHOUT CONTRAST  TECHNIQUE: Contiguous axial images were obtained from the base of the skull through the vertex without intravenous contrast.  COMPARISON:  February 11, 2003  FINDINGS: The ventricles are normal in size and configuration. There is no mass, hemorrhage, extra-axial fluid collection, or midline shift. Gray and white compartments appear normal. No acute infarct apparent. Middle cerebral arteries appear symmetric and unremarkable bilaterally. Bony calvarium appears intact. The mastoid air cells are clear.  IMPRESSION: Study within normal limits. No intracranial mass, hemorrhage, or focal gray lesions/acute appearing infarct.  Critical Value/emergent results were called by telephone at the time of interpretation on 07/09/2014 at 12:33 pm to  Dr. Roseanne Reno, neurology, who verbally acknowledged these results.   Electronically Signed   By: Bretta Bang III M.D.   On: 07/09/2014 12:33     EKG: Sinus tachycardia with nonspecific ST-T wave changes, QTC 483 ms   Assessment & Plan  Principal Problem:   TIA (transient ischemic attack) -Admit to neuro telemetry floor -Patient has had recurrence of speech abnormalities without any other focal neurological deficits -Appreciate neurology assistance -Obtain MRI/MRA head stat -Agree with frequent neuro checks every 2 hours -Risk factor stratification: Hemoglobin A1c and fasting lipid panel -Begin statin (Lipitor) -Control blood pressure but do not overcorrect -Check 2-D echocardiogram, bilateral carotid duplex as well -She has passed the swallowing evaluation so allow heart healthy diet -No evidence of hemorrhaging on CT of the head so okay to utilize pharmacological DVT prophylaxis  Active Problems:   Hypertensive urgency -Blood pressure improved without any medical intervention -When necessary hydralazine for systolic blood pressure greater than or equal to 190 or diastolic blood pressure greater than or equal to 110 -Avoid overcorrection in the first 72 hours of acute potential ischemic stroke symptoms    Cocaine abuse -Admitted to smoking crack cocaine prior to onset of symptoms -Urine drug screen obtained with results pending -Patient counseled regarding absolute cessation    Alcohol abuse, daily use -Admits to at least 3-4 beers daily stating she has increased her use frequently due to ongoing stress -Like to avoid acute alcohol withdrawal in setting of current neurological decline -Begin MedSurg CIWA protocol -Counseled regarding cessation    Hypokalemia -Replete 1 -bmet in a.m.    Tobacco abuse -Admits to only about 3 cigarettes per day; counseled regarding cessation    History of seizure -Unclear if seizures were related to alcohol withdrawal or truly  underlying seizure disorder -Patient on Depakote prior to admission but suspect this is being utilized to treat her bipolar disorder    Bipolar  2 disorder with anxiety component -Continue home medications of Depakote and Neurontin, Prozac and hydroxyzine    DVT Prophylaxis: Subcutaneous heparin  Family Communication:  No family at bedside   Code Status:  Full  Condition: Guarded   Time spent in minutes : 60   ELLIS,ALLISON L. ANP on 07/09/2014 at 2:09 PM  Between 7am to 7pm - Pager - [phone]  After 7pm go to [email or web address] - password [email or web address]  And look for the night coverage person covering me after hours  [facility]  Addendum  I personally evaluated patient on 07/09/2014 and agree with above findings Patient is a 44 year old with a past medical history of cocaine abuse, presenting to the emergency department with complaints of slurred speech. She reported going to a friend's house this morning where she smoked crack cocaine after which she started having slurred speech, difficulties getting words out, and confusion. She has found to be hypertensive with blood pressure of 280/120 by EMS. Code CVA was called. CT scan of brain without contrast showing no intracranial hemorrhage or acute infarct. Patient was seen and evaluated by neurology. During my evaluation she continues to have word finding difficulties and some slurred speech. Had 5 of 5 muscle strength to bilateral upper and lower extremities. She will be admitted to telemetry to be placed on CVA protocol, obtain MRI/MRA, carotid Dopplers, transthoracic echocardiogram. Antiplatelet therapy with aspirin.

## 2014-07-09 NOTE — Progress Notes (Signed)
Admission H&P    Chief Complaint: New onset slurred speech and dizziness as as well as confusion.  HPI: Sylvia Arellano is an 44 y.o. female with a history of bipolar affective disorder and seizures who was brought to the emergency room and code stroke status: The onset of dizziness and slurred speech as well as confusion at 11:15 AM today. She has no previous history of stroke nor TIA. She has not been on antiplatelet therapy. Pressure initially was markedly elevated at 280/120. Blood pressure improved in the emergency room without intervention with her last blood pressure being 140/98. CT scan of her head showed no acute intracranial abnormality. NIH stroke score was 0. Mental status changes resolved, as did slurred speech. She had no focal weakness nor numbness.  LSN: 11:15 AM on 07/09/2014 TPA Given: No: Deficits resolved  Past Medical History  Diagnosis Date  . Bipolar 2 disorder   . Suicide attempt   . Seizures     History reviewed. No pertinent past surgical history.  Family history: Mother died at age 42 from myocardial infarction. Her father is presumably in good health area to family history is negative for stroke, as well as hypertension and diabetes.  Social History:  reports that she has been smoking.  She does not have any smokeless tobacco history on file. She reports that she drinks alcohol. She reports that she uses illicit drugs (Cocaine).  Allergies: No Known Allergies  Medications: Gabapentin, Depakote and fluoxetine.  ROS: History obtained from the patient  General ROS: negative for - chills, fatigue, fever, night sweats, weight gain or weight loss Psychological ROS: negative for - behavioral disorder, hallucinations, memory difficulties, mood swings or suicidal ideation Ophthalmic ROS: negative for - blurry vision, double vision, eye pain or loss of vision ENT ROS: negative for - epistaxis, nasal discharge, oral lesions, sore throat, tinnitus or vertigo Allergy  and Immunology ROS: negative for - hives or itchy/watery eyes Hematological and Lymphatic ROS: negative for - bleeding problems, bruising or swollen lymph nodes Endocrine ROS: negative for - galactorrhea, hair pattern changes, polydipsia/polyuria or temperature intolerance Respiratory ROS: negative for - cough, hemoptysis, shortness of breath or wheezing Cardiovascular ROS: negative for - chest pain, dyspnea on exertion, edema or irregular heartbeat Gastrointestinal ROS: negative for - abdominal pain, diarrhea, hematemesis, nausea/vomiting or stool incontinence Genito-Urinary ROS: negative for - dysuria, hematuria, incontinence or urinary frequency/urgency Musculoskeletal ROS: negative for - joint swelling or muscular weakness Neurological ROS: as noted in HPI Dermatological ROS: negative for rash and skin lesion changes  Physical Examination: There were no vitals taken for this visit.  HEENT-  Normocephalic, no lesions, without obvious abnormality.  Normal external eye and conjunctiva.  Normal TM's bilaterally.  Normal auditory canals and external ears. Normal external nose, mucus membranes and septum.  Normal pharynx. Neck supple with no masses, nodes, nodules or enlargement. Cardiovascular - regular rate and rhythm, S1, S2 normal, no murmur, click, rub or gallop Lungs - chest clear, no wheezing, rales, normal symmetric air entry Abdomen - soft, non-tender; bowel sounds normal; no masses,  no organomegaly Extremities - no joint deformities, effusion, or inflammation and no edema Skin - no skin lesions noted. No areas of discoloration noted.  Neurologic Examination: Mental Status: Alert, oriented, thought content appropriate.  Speech fluent without evidence of aphasia. Able to follow commands without difficulty. Cranial Nerves: II-Visual fields were normal. III/IV/VI-Pupils were equal and reacted. Extraocular movements were full and conjugate.    V/VII-no facial numbness and no facial  weakness. VIII-normal. X-normal speech and symmetrical palatal movement. XI: trapezius strength/neck flexion strength normal bilaterally XII-midline tongue extension with normal strength. Motor: 5/5 bilaterally with normal tone and bulk Sensory: Normal throughout. Deep Tendon Reflexes: 2+ and symmetric. Plantars: Flexor bilaterally Cerebellar: Normal finger-to-nose testing. Carotid auscultation: Normal  Results for orders placed or performed during the hospital encounter of 07/09/14 (from the past 48 hour(s))  Protime-INR     Status: Abnormal   Collection Time: 07/09/14 12:12 PM  Result Value Ref Range   Prothrombin Time 15.3 (H) 11.6 - 15.2 seconds   INR 1.20 0.00 - 1.49  APTT     Status: None   Collection Time: 07/09/14 12:12 PM  Result Value Ref Range   aPTT 30 24 - 37 seconds  CBC     Status: Abnormal   Collection Time: 07/09/14 12:12 PM  Result Value Ref Range   WBC 5.6 4.0 - 10.5 K/uL   RBC 4.32 3.87 - 5.11 MIL/uL   Hemoglobin 11.8 (L) 12.0 - 15.0 g/dL   HCT 78.4 (L) 69.6 - 29.5 %   MCV 82.2 78.0 - 100.0 fL   MCH 27.3 26.0 - 34.0 pg   MCHC 33.2 30.0 - 36.0 g/dL   RDW 28.4 13.2 - 44.0 %   Platelets 282 150 - 400 K/uL  Differential     Status: Abnormal   Collection Time: 07/09/14 12:12 PM  Result Value Ref Range   Neutrophils Relative % 41 (L) 43 - 77 %   Neutro Abs 2.3 1.7 - 7.7 K/uL   Lymphocytes Relative 46 12 - 46 %   Lymphs Abs 2.5 0.7 - 4.0 K/uL   Monocytes Relative 12 3 - 12 %   Monocytes Absolute 0.7 0.1 - 1.0 K/uL   Eosinophils Relative 0 0 - 5 %   Eosinophils Absolute 0.0 0.0 - 0.7 K/uL   Basophils Relative 1 0 - 1 %   Basophils Absolute 0.0 0.0 - 0.1 K/uL  I-Stat Troponin, ED (not at [facility])     Status: None   Collection Time: 07/09/14 12:22 PM  Result Value Ref Range   Troponin i, poc 0.00 0.00 - 0.08 ng/mL   Comment 3            Comment: Due to the release kinetics of cTnI, a negative result within the first hours of the onset of symptoms does not  rule out myocardial infarction with certainty. If myocardial infarction is still suspected, repeat the test at appropriate intervals.   I-Stat Chem 8, ED     Status: Abnormal   Collection Time: 07/09/14 12:24 PM  Result Value Ref Range   Sodium 135 135 - 145 mmol/L   Potassium 3.4 (L) 3.5 - 5.1 mmol/L   Chloride 100 96 - 112 mmol/L   BUN 10 6 - 23 mg/dL   Creatinine, Ser 1.02 0.50 - 1.10 mg/dL   Glucose, Bld 725 (H) 70 - 99 mg/dL   Calcium, Ion 3.66 (L) 1.12 - 1.23 mmol/L   TCO2 14 0 - 100 mmol/L   Hemoglobin 13.9 12.0 - 15.0 g/dL   HCT 44.0 34.7 - 42.5 %   Ct Head Wo Contrast  07/09/2014   CLINICAL DATA:  Acute onset slurred speech and confusion; dizziness  EXAM: CT HEAD WITHOUT CONTRAST  TECHNIQUE: Contiguous axial images were obtained from the base of the skull through the vertex without intravenous contrast.  COMPARISON:  February 11, 2003  FINDINGS: The ventricles are normal in size and configuration. There  is no mass, hemorrhage, extra-axial fluid collection, or midline shift. Gray and white compartments appear normal. No acute infarct apparent. Middle cerebral arteries appear symmetric and unremarkable bilaterally. Bony calvarium appears intact. The mastoid air cells are clear.  IMPRESSION: Study within normal limits. No intracranial mass, hemorrhage, or focal gray lesions/acute appearing infarct.  Critical Value/emergent results were called by telephone at the time of interpretation on 07/09/2014 at 12:33 pm to Dr. Roseanne Reno, neurology, who verbally acknowledged these results.   Electronically Signed   By: Bretta Bang III M.D.   On: 07/09/2014 12:33    Assessment/Plan 44 year old lady presenting with transient confusion and dizziness as well as slurred speech in association with elevated blood pressure. Etiology is unclear. TIA cannot be ruled out. Small vessel subcortical infarction cannot be ruled out, as well.  Stroke risk factors: Hypertension  Plan: 1. HgbA1c, fasting  lipid panel 2. MRI, MRA  of the brain without contrast 3. PT consult, OT consult, Speech consult 4. Echocardiogram 5. Carotid dopplers 6. Prophylactic therapy-Antiplatelet med: Aspirin  7. Risk  Factor modification   C.R. Roseanne Reno, MD [facility] [phone]  07/09/2014, 12:53 PM

## 2014-07-09 NOTE — Progress Notes (Signed)
  Echocardiogram 2D Echocardiogram has been performed.  Delcie RochENNINGTON, Deagan Sevin 07/09/2014, 3:41 PM

## 2014-07-09 NOTE — Code Documentation (Signed)
44[facility].  EMS reports sudden onset dizziness, confusion and stumbling until she fell.  Patient hypertensive with BP of 180/120 en route.  Labs drawn on arrival.  Stroke team at the bedside.  Patient to CT.  NIHSS 0, see documentation for details and code stroke times.  Patient reports that dizziness has resolved.  Patient seems to be anxious on exam.  Dr. Roseanne RenoStewart at the bedside.  TIA alert.  No acute stroke treatment at this time.  Bedside handoff with ED RN Cicero DuckErika.

## 2014-07-09 NOTE — ED Provider Notes (Signed)
CSN: [id number]     Arrival date & time 07/09/14  1208 History   First MD Initiated Contact with Patient 07/09/14 1207     Chief Complaint  Patient presents with  . Code Stroke    An emergency department physician performed an initial assessment on this suspected stroke patient at 1209. (Consider location/radiation/quality/duration/timing/severity/associated sxs/prior Treatment) HPI   64y F with transient confusion and dizziness as well as slurred speech. Onset around 1145 today. Made Code Stroke. On prehospital assessment, noted to be extremely hypertensive with BP 280/120 for EMS. Improved by arrival and 130/80s by the time I assessed her after she returned from CT.  She was evaluated by neurology and NIH stroke score of 0. Recommending admission for TIA work-up. She had no further complaints on my assessment. She feels like she is back to her baseline. When discussing things further though she does admit to cocaine use shortly before her symptoms began. Unfortunately, she has been dealing with substance abuse for quite some time.   Past Medical History  Diagnosis Date  . Bipolar 2 disorder   . Suicide attempt   . Seizures    History reviewed. No pertinent past surgical history. No family history on file. History  Substance Use Topics  . Smoking status: Current Every Day Smoker -- 0.50 packs/day  . Smokeless tobacco: Not on file  . Alcohol Use: Yes     Comment: "twice a week"   OB History    No data available     Review of Systems  All systems reviewed and negative, other than as noted in HPI.   Allergies  Review of patient's allergies indicates no known allergies.  Home Medications   Prior to Admission medications   Medication Sig Start Date End Date Taking? Authorizing Provider  HYDROXYZINE PAMOATE PO Take by mouth.   Yes Historical Provider, MD   LMP  Physical Exam  Constitutional: She is oriented to person, place, and time. She appears well-developed and  well-nourished. No distress.  HENT:  Head: Normocephalic and atraumatic.  Eyes: Conjunctivae are normal. Right eye exhibits no discharge. Left eye exhibits no discharge.  Neck: Neck supple.  Cardiovascular: Normal rate, regular rhythm and normal heart sounds.  Exam reveals no gallop and no friction rub.   No murmur heard. Pulmonary/Chest: Effort normal and breath sounds normal. No respiratory distress.  Abdominal: Soft. She exhibits no distension. There is no tenderness.  Musculoskeletal: She exhibits no edema or tenderness.  Neurological: She is alert and oriented to person, place, and time. No cranial nerve deficit. She exhibits normal muscle tone. Coordination normal.  Speech somewhat pressured, but not dysarthric  Skin: Skin is warm and dry.  Psychiatric: She has a normal mood and affect. Her behavior is normal. Thought content normal.  Nursing note and vitals reviewed.   ED Course  Procedures (including critical care time) Labs Review Labs Reviewed  ETHANOL - Abnormal; Notable for the following:    Alcohol, Ethyl (B) 11 (*)    All other components within normal limits  PROTIME-INR - Abnormal; Notable for the following:    Prothrombin Time 15.3 (*)    All other components within normal limits  CBC - Abnormal; Notable for the following:    Hemoglobin 11.8 (*)    HCT 35.5 (*)    All other components within normal limits  DIFFERENTIAL - Abnormal; Notable for the following:    Neutrophils Relative % 41 (*)    All other components within normal limits  COMPREHENSIVE METABOLIC PANEL - Abnormal; Notable for the following:    Sodium 134 (*)    Potassium 3.4 (*)    CO2 18 (*)    Glucose, Bld 171 (*)    Creatinine, Ser 1.13 (*)    AST 40 (*)    GFR calc non Af Amer 60 (*)    GFR calc Af Amer 69 (*)    Anion gap 17 (*)    All other components within normal limits  I-STAT CHEM 8, ED - Abnormal; Notable for the following:    Potassium 3.4 (*)    Glucose, Bld 175 (*)    Calcium,  Ion 1.05 (*)    All other components within normal limits  APTT  URINE RAPID DRUG SCREEN (HOSP PERFORMED)  URINALYSIS, ROUTINE W REFLEX MICROSCOPIC  I-STAT TROPOININ, ED  I-STAT TROPOININ, ED    Imaging Review Ct Head Wo Contrast  07/09/2014   CLINICAL DATA:  Acute onset slurred speech and confusion; dizziness  EXAM: CT HEAD WITHOUT CONTRAST  TECHNIQUE: Contiguous axial images were obtained from the base of the skull through the vertex without intravenous contrast.  COMPARISON:  February 11, 2003  FINDINGS: The ventricles are normal in size and configuration. There is no mass, hemorrhage, extra-axial fluid collection, or midline shift. Gray and white compartments appear normal. No acute infarct apparent. Middle cerebral arteries appear symmetric and unremarkable bilaterally. Bony calvarium appears intact. The mastoid air cells are clear.  IMPRESSION: Study within normal limits. No intracranial mass, hemorrhage, or focal gray lesions/acute appearing infarct.  Critical Value/emergent results were called by telephone at the time of interpretation on 07/09/2014 at 12:33 pm to Dr. Roseanne RenoStewart, neurology, who verbally acknowledged these results.   Electronically Signed   By: Bretta BangWilliam  Woodruff III M.D.   On: 07/09/2014 12:33     EKG Interpretation   Date/Time:  Friday July 09 2014 12:28:38 EST Ventricular Rate:  107 PR Interval:  181 QRS Duration: 78 QT Interval:  365 QTC Calculation: 487 R Axis:   32 Text Interpretation:  Sinus tachycardia Borderline T wave abnormalities  Borderline prolonged QT interval Confirmed by Juleen ChinaKOHUT  MD, Bentzion Dauria ([id number]) on  07/09/2014 2:02:33 PM      MDM   Final diagnoses:  Transient cerebral ischemia, unspecified transient cerebral ischemia type  Cocaine use    43yF with TIA symptoms. Currently resolved. On further questioning, admits to cocaine use shortly before symptom onset. Symptoms probably cocaine induced, but cannot completely r/o other pathology. Will  discuss with medicine for admit for further TIA eval.     Raeford RazorStephen Amron Guerrette, MD 07/09/14 [phone]

## 2014-07-09 NOTE — Progress Notes (Signed)
*  PRELIMINARY RESULTS* Vascular Ultrasound Carotid Duplex (Doppler) has been completed.   Findings suggest 1-39% internal carotid artery stenosis bilaterally. Vertebral arteries are patent with antegrade flow.  07/09/2014 4:44 PM Sylvia Arellano, RVT, RDCS, RDMS

## 2014-07-09 NOTE — ED Notes (Signed)
4N informed of pt delay to floor.

## 2014-07-09 NOTE — ED Notes (Addendum)
Pt returned from vascular

## 2014-07-09 NOTE — ED Notes (Addendum)
[facility]/o Code Stroke. Pt was acting normal when arrived at [facility] then at 1115 pt started stumbling around, having slurred speech, and some confusion. BP per EMS 280/120 Hr 130. Denies have high blood pressure but stated that she has had seizures in the past. Currently pt appears a little anxious. Denies any pain.

## 2014-07-10 ENCOUNTER — Inpatient Hospital Stay (HOSPITAL_COMMUNITY): Payer: Self-pay

## 2014-07-10 DIAGNOSIS — F3189 Other bipolar disorder: Secondary | ICD-10-CM

## 2014-07-10 DIAGNOSIS — I674 Hypertensive encephalopathy: Secondary | ICD-10-CM | POA: Insufficient documentation

## 2014-07-10 DIAGNOSIS — E876 Hypokalemia: Secondary | ICD-10-CM

## 2014-07-10 DIAGNOSIS — Z720 Tobacco use: Secondary | ICD-10-CM

## 2014-07-10 DIAGNOSIS — I10 Essential (primary) hypertension: Secondary | ICD-10-CM

## 2014-07-10 DIAGNOSIS — F1490 Cocaine use, unspecified, uncomplicated: Secondary | ICD-10-CM | POA: Insufficient documentation

## 2014-07-10 DIAGNOSIS — G459 Transient cerebral ischemic attack, unspecified: Secondary | ICD-10-CM

## 2014-07-10 LAB — BASIC METABOLIC PANEL
Anion gap: 6 (ref 5–15)
BUN: 15 mg/dL (ref 6–23)
CALCIUM: 10 mg/dL (ref 8.4–10.5)
CO2: 25 mmol/L (ref 19–32)
Chloride: 106 mmol/L (ref 96–112)
Creatinine, Ser: 1.16 mg/dL — ABNORMAL HIGH (ref 0.50–1.10)
GFR calc Af Amer: 67 mL/min — ABNORMAL LOW (ref >90)
GFR calc non Af Amer: 58 mL/min — ABNORMAL LOW (ref >90)
Glucose, Bld: 141 mg/dL — ABNORMAL HIGH (ref 70–99)
POTASSIUM: 3.7 mmol/L (ref 3.5–5.1)
Sodium: 137 mmol/L (ref 135–145)

## 2014-07-10 LAB — CBC
HEMATOCRIT: 33.8 % — AB (ref 36.0–46.0)
Hemoglobin: 11.1 g/dL — ABNORMAL LOW (ref 12.0–15.0)
MCH: 26.7 pg (ref 26.0–34.0)
MCHC: 32.8 g/dL (ref 30.0–36.0)
MCV: 81.4 fL (ref 78.0–100.0)
Platelets: 260 10*3/uL (ref 150–400)
RBC: 4.15 MIL/uL (ref 3.87–5.11)
RDW: 15.8 % — ABNORMAL HIGH (ref 11.5–15.5)
WBC: 4.4 10*3/uL (ref 4.0–10.5)

## 2014-07-10 LAB — LIPID PANEL
Cholesterol: 157 mg/dL (ref 0–200)
HDL: 73 mg/dL (ref >39)
LDL Cholesterol: 68 mg/dL (ref 0–99)
Total CHOL/HDL Ratio: 2.2 RATIO
Triglycerides: 79 mg/dL (ref <150)
VLDL: 16 mg/dL (ref 0–40)

## 2014-07-10 LAB — LACTIC ACID, PLASMA
LACTIC ACID, VENOUS: 0.9 mmol/L (ref 0.5–2.0)
Lactic Acid, Venous: 1.6 mmol/L (ref 0.5–2.0)

## 2014-07-10 MED ORDER — FLUOXETINE HCL 20 MG PO CAPS
20.0000 mg | ORAL_CAPSULE | Freq: Two times a day (BID) | ORAL | Status: DC
  Administered 2014-07-10 – 2014-07-12 (×5): 20 mg via ORAL
  Filled 2014-07-10 (×4): qty 1

## 2014-07-10 MED ORDER — SODIUM CHLORIDE 0.9 % IV BOLUS (SEPSIS)
500.0000 mL | Freq: Once | INTRAVENOUS | Status: AC
  Administered 2014-07-10: 500 mL via INTRAVENOUS

## 2014-07-10 MED ORDER — SODIUM CHLORIDE 0.9 % IV SOLN
INTRAVENOUS | Status: DC
  Administered 2014-07-10: 100 mL/h via INTRAVENOUS
  Administered 2014-07-10 – 2014-07-11 (×3): via INTRAVENOUS

## 2014-07-10 MED ORDER — HYDROXYZINE PAMOATE 50 MG PO CAPS
100.0000 mg | ORAL_CAPSULE | Freq: Two times a day (BID) | ORAL | Status: DC
  Administered 2014-07-10 – 2014-07-11 (×3): 100 mg via ORAL
  Filled 2014-07-10 (×4): qty 2

## 2014-07-10 MED ORDER — DIVALPROEX SODIUM ER 500 MG PO TB24
1000.0000 mg | ORAL_TABLET | Freq: Every day | ORAL | Status: DC
  Administered 2014-07-10 – 2014-07-11 (×2): 1000 mg via ORAL
  Filled 2014-07-10 (×3): qty 2

## 2014-07-10 MED ORDER — GABAPENTIN 400 MG PO CAPS
1200.0000 mg | ORAL_CAPSULE | Freq: Three times a day (TID) | ORAL | Status: DC
  Administered 2014-07-10 – 2014-07-12 (×7): 1200 mg via ORAL
  Filled 2014-07-10 (×7): qty 3

## 2014-07-10 NOTE — Progress Notes (Signed)
Daughter:  Levada DyShamisa Vanburen [phone] - Contact wishes Social Worker assistance in drug rehab for her mother.  Emotional support given, offered chaplaincy consult.

## 2014-07-10 NOTE — Progress Notes (Signed)
[facility] PROGRESS NOTE  Gershon Musseloneka S Ma ZOX:[id number] DOB: [birth year] DOA: 07/09/2014 PCP: No primary care provider on file.  Assessment/Plan: TIA:  Recent cocaine use.  MRI negative. ECHO and doppler no significant abnormalities.  LDL 68, Hb-A1c pending.  Continue with aspirin.  EEG ordered.   Hypotension: IV bolus, IV fluids.  Lactic acid negative,. No leukocytosis or fever.  Check cortisol level.   Syncope;  Probably related to drugs.  ECHO with normal Ef.  Doppler no significant stenosis.   Hypertensive urgency; likely in setting of cocaine use.   now with hypotension.   Cocaine abuse;  SW consulted patient interested in rehab.   History of seizure; continue with Depakote.   Alcohol abuse, daily use CIWA protocol.   Bipolar 2 disorder with anxiety component -Continue home medications of Depakote and Neurontin, Prozac and hydroxyzine  Code Status: Full Code.  Family Communication: Care discussed with daughter.  Disposition Plan: remain inpatient   Consultants:  neuro  Procedures:  ECHO; no source of embolism, normal EF  Doppler; Carotid Duplex (Doppler) has been completed. Findings suggest 1-39% internal carotid artery stenosis bilaterally. Vertebral arteries are patent with antegrade flow.  Antibiotics:  none  HPI/Subjective: Patient alert speech clear. Relates mild cough. No diarrhea, no abdominal pain.   Objective: Filed Vitals:   07/10/14 1428  BP: 83/41  Pulse: 76  Temp: 98.6 F (37 C)  Resp: 18   No intake or output data in the 24 hours ending 07/10/14 1445 Filed Weights   07/09/14 2000  Weight: 63.458 kg (139 lb 14.4 oz)    Exam:   General:  Alert in no distress.  Cardiovascular: S 1, S 2 RRR  Respiratory: CTA  Abdomen: BS present, soft, nt  Musculoskeletal:trace edema.    Data Reviewed: Basic Metabolic Panel:  Recent Labs Lab 07/09/14 1212 07/09/14 1224 07/10/14 0700  NA 134* 135 137  K 3.4* 3.4* 3.7   CL 99 100 106  CO2 18*  --  25  GLUCOSE 171* 175* 141*  BUN 10 10 15   CREATININE 1.13* 1.00 1.16*  CALCIUM 9.2  --  10.0   Liver Function Tests:  Recent Labs Lab 07/09/14 1212  AST 40*  ALT 20  ALKPHOS 71  BILITOT 0.6  PROT 8.2  ALBUMIN 4.4   No results for input(s): LIPASE, AMYLASE in the last 168 hours. No results for input(s): AMMONIA in the last 168 hours. CBC:  Recent Labs Lab 07/09/14 1212 07/09/14 1224 07/10/14 0700  WBC 5.6  --  4.4  NEUTROABS 2.3  --   --   HGB 11.8* 13.9 11.1*  HCT 35.5* 41.0 33.8*  MCV 82.2  --  81.4  PLT 282  --  260   Cardiac Enzymes: No results for input(s): CKTOTAL, CKMB, CKMBINDEX, TROPONINI in the last 168 hours. BNP (last 3 results) No results for input(s): BNP in the last 8760 hours.  ProBNP (last 3 results) No results for input(s): PROBNP in the last 8760 hours.  CBG: No results for input(s): GLUCAP in the last 168 hours.  No results found for this or any previous visit (from the past 240 hour(s)).   Studies: Dg Chest 2 View  07/10/2014   CLINICAL DATA:  Cough, congestion for 1 month  EXAM: CHEST  2 VIEW  COMPARISON:  None.  FINDINGS: Cardiomediastinal silhouette is unremarkable. No acute infiltrate or pleural effusion. No pulmonary edema. Bony thorax is unremarkable.  IMPRESSION: No active cardiopulmonary disease.   Electronically Signed  By: Natasha Mead M.D.   On: 07/10/2014 10:49   Ct Head Wo Contrast  07/09/2014   CLINICAL DATA:  Acute onset slurred speech and confusion; dizziness  EXAM: CT HEAD WITHOUT CONTRAST  TECHNIQUE: Contiguous axial images were obtained from the base of the skull through the vertex without intravenous contrast.  COMPARISON:  February 11, 2003  FINDINGS: The ventricles are normal in size and configuration. There is no mass, hemorrhage, extra-axial fluid collection, or midline shift. Gray and white compartments appear normal. No acute infarct apparent. Middle cerebral arteries appear symmetric and  unremarkable bilaterally. Bony calvarium appears intact. The mastoid air cells are clear.  IMPRESSION: Study within normal limits. No intracranial mass, hemorrhage, or focal gray lesions/acute appearing infarct.  Critical Value/emergent results were called by telephone at the time of interpretation on 07/09/2014 at 12:33 pm to Dr. Roseanne Reno, neurology, who verbally acknowledged these results.   Electronically Signed   By: Bretta Bang III M.D.   On: 07/09/2014 12:33   Mr Maxine Glenn Head Wo Contrast  07/09/2014   CLINICAL DATA:  TIA. Acute onset slurred speech and confusion today. Hypertension. Cocaine use today.  EXAM: MRI HEAD WITHOUT CONTRAST  MRA HEAD WITHOUT CONTRAST  TECHNIQUE: Multiplanar, multiecho pulse sequences of the brain and surrounding structures were obtained without intravenous contrast. Angiographic images of the head were obtained using MRA technique without contrast.  COMPARISON:  CT head 07/09/2014  FINDINGS: MRI HEAD FINDINGS  Ventricle size is normal. Cerebral volume is normal craniocervical junction normal. Pituitary normal in size.  Negative for acute or chronic infarct  Negative for demyelinating disease.  Negative for intracranial hemorrhage  Negative for mass or edema.  Paranasal sinuses are clear.  MRA HEAD FINDINGS  Vertebral artery is patent bilaterally. PICA patent bilaterally. Basilar widely patent. Superior cerebellar and posterior cerebral artery is patent bilaterally. Posterior communicating artery is patent bilaterally.  Internal carotid artery widely patent bilaterally. Anterior and middle cerebral arteries are patent bilaterally without stenosis  Negative for cerebral aneurysm.  IMPRESSION: Negative MRI and MRA of the head.   Electronically Signed   By: Marlan Palau M.D.   On: 07/09/2014 18:47   Mr Brain Wo Contrast  07/09/2014   CLINICAL DATA:  TIA. Acute onset slurred speech and confusion today. Hypertension. Cocaine use today.  EXAM: MRI HEAD WITHOUT CONTRAST  MRA HEAD  WITHOUT CONTRAST  TECHNIQUE: Multiplanar, multiecho pulse sequences of the brain and surrounding structures were obtained without intravenous contrast. Angiographic images of the head were obtained using MRA technique without contrast.  COMPARISON:  CT head 07/09/2014  FINDINGS: MRI HEAD FINDINGS  Ventricle size is normal. Cerebral volume is normal craniocervical junction normal. Pituitary normal in size.  Negative for acute or chronic infarct  Negative for demyelinating disease.  Negative for intracranial hemorrhage  Negative for mass or edema.  Paranasal sinuses are clear.  MRA HEAD FINDINGS  Vertebral artery is patent bilaterally. PICA patent bilaterally. Basilar widely patent. Superior cerebellar and posterior cerebral artery is patent bilaterally. Posterior communicating artery is patent bilaterally.  Internal carotid artery widely patent bilaterally. Anterior and middle cerebral arteries are patent bilaterally without stenosis  Negative for cerebral aneurysm.  IMPRESSION: Negative MRI and MRA of the head.   Electronically Signed   By: Marlan Palau M.D.   On: 07/09/2014 18:47    Scheduled Meds: . aspirin  300 mg Rectal Daily   Or  . aspirin  325 mg Oral Daily  . atorvastatin  20 mg Oral q1800  .  divalproex  1,000 mg Oral QHS  . FLUoxetine  20 mg Oral BID  . folic acid  1 mg Oral Daily  . gabapentin  1,200 mg Oral TID  . heparin  5,000 Units Subcutaneous 3 times per day  . hydrOXYzine  100 mg Oral BID  . LORazepam  0-4 mg Oral Q6H   Followed by  . [START ON 07/11/2014] LORazepam  0-4 mg Oral Q12H  . multivitamin with minerals  1 tablet Oral Daily  . sodium chloride  500 mL Intravenous Once  . thiamine  100 mg Oral Daily   Continuous Infusions: . sodium chloride 125 mL/hr at 07/10/14 1610    Principal Problem:   TIA (transient ischemic attack) Active Problems:   Hypertensive urgency   Cocaine abuse   Alcohol abuse, daily use   Tobacco abuse   History of seizure   Bipolar 2  disorder   Hypokalemia    Time spent: 35 minutes.     Hartley Barefoot A  [facility] Pager [phone]. If 7PM-7AM, please contact night-coverage at [email or web address], password [facility] 07/10/2014, 2:45 PM  LOS: 1 day

## 2014-07-10 NOTE — Progress Notes (Addendum)
STROKE TEAM PROGRESS NOTE   HISTORY Sylvia Arellano is a 44 y.o. female with a history of bipolar affective disorder and seizures who was brought to the emergency room and code stroke status: The onset of dizziness and slurred speech as well as confusion at 11:15 AM today. She has no previous history of stroke nor TIA. She has not been on antiplatelet therapy. Pressure initially was markedly elevated at 280/120. Blood pressure improved in the emergency room without intervention with her last blood pressure being 140/98. CT scan of her head showed no acute intracranial abnormality. NIH stroke score was 0. Mental status changes resolved, as did slurred speech. She had no focal weakness nor numbness.  LSN: 11:15 AM on 07/09/2014 TPA Given: No: Deficits resolved  SUBJECTIVE (INTERVAL HISTORY) The patient had a friend at bedside. The patient states she was walking to a friend's house when she became dizzy, fell and had loss of consciousness. She denies any palpitations or chest discomfort. She admits to having recently used cocaine. She does not know how long she was unconscious. EMS was summoned and she was brought in as a code stroke. She denies recent stress. She does have bipolar disorder and is followed by her psychiatrist Dr. Joanne Arellano.   She stated that she has seizure in the past, was told to passed out with shaking, lasting minutes. Last seizure many years ago. However, she has not had any EEG or seizure medications. She stated that she might have seizure this time. However, her friends who witness the episode yesterday was not able to be contacted. No details about what happened are available.  OBJECTIVE Temp:  [98.1 F (36.7 C)-99.1 F (37.3 C)] 98.1 F (36.7 C) (02/20 0625) Pulse Rate:  [67-94] 67 (02/20 0625) Cardiac Rhythm:  [-] Normal sinus rhythm (02/19 2000) Resp:  [16-24] 18 (02/20 0625) BP: (85-148)/(44-97) 85/44 mmHg (02/20 0625) SpO2:  [100 %] 100 % (02/20 0625) Weight:   [63.458 kg (139 lb 14.4 oz)] 63.458 kg (139 lb 14.4 oz) (02/19 2000)  No results for input(s): GLUCAP in the last 168 hours.  Recent Labs Lab 07/09/14 1212 07/09/14 1224  NA 134* 135  K 3.4* 3.4*  CL 99 100  CO2 18*  --   GLUCOSE 171* 175*  BUN 10 10  CREATININE 1.13* 1.00  CALCIUM 9.2  --     Recent Labs Lab 07/09/14 1212  AST 40*  ALT 20  ALKPHOS 71  BILITOT 0.6  PROT 8.2  ALBUMIN 4.4    Recent Labs Lab 07/09/14 1212 07/09/14 1224  WBC 5.6  --   NEUTROABS 2.3  --   HGB 11.8* 13.9  HCT 35.5* 41.0  MCV 82.2  --   PLT 282  --    No results for input(s): CKTOTAL, CKMB, CKMBINDEX, TROPONINI in the last 168 hours.  Recent Labs  07/09/14 1212  LABPROT 15.3*  INR 1.20    Recent Labs  07/09/14 1347  COLORURINE YELLOW  LABSPEC 1.019  PHURINE 6.0  GLUCOSEU NEGATIVE  HGBUR NEGATIVE  BILIRUBINUR NEGATIVE  KETONESUR NEGATIVE  PROTEINUR 30*  UROBILINOGEN 0.2  NITRITE NEGATIVE  LEUKOCYTESUR NEGATIVE    No results found for: CHOL, TRIG, HDL, CHOLHDL, VLDL, LDLCALC No results found for: HGBA1C    Component Value Date/Time   LABOPIA NONE DETECTED 07/09/2014 1347   COCAINSCRNUR POSITIVE* 07/09/2014 1347   LABBENZ NONE DETECTED 07/09/2014 1347   AMPHETMU NONE DETECTED 07/09/2014 1347   THCU NONE DETECTED 07/09/2014 1347  LABBARB NONE DETECTED 07/09/2014 1347     Recent Labs Lab 07/09/14 1212  ETH 11*    2-D echocardiogram 07/09/2014 Study Conclusions - Left ventricle: The cavity size was normal. Wall thickness was normal. The estimated ejection fraction was 60%. Wall motion was normal; there were no regional wall motion abnormalities. - Right ventricle: The cavity size was normal. Systolic function was normal. - Impressions: No cardiac source of embolism was identified, but cannot be ruled out on the basis of this examination.  Impressions: - No cardiac source of embolism was identified, but cannot be ruled out on the basis of this  examination.  Ct Head Wo Contrast 07/09/2014    Study within normal limits. No intracranial mass, hemorrhage, or focal gray lesions/acute appearing infarct.    Mr Sylvia LatchMra Head Wo Contrast 07/09/2014    Negative MRI and MRA of the head.    Chest x-ray two-view  07/10/2014 No active cardiopulmonary disease.  CUS - Bilateral: 1-39% ICA stenosis. Vertebral artery flow is antegrade.  EEG - pending  PHYSICAL EXAM  Temp:  [98.1 F (36.7 C)-99 F (37.2 C)] 98.6 F (37 C) (02/20 1428) Pulse Rate:  [67-83] 79 (02/20 1729) Resp:  [16-18] 18 (02/20 1428) BP: (77-110)/(41-77) 109/71 mmHg (02/20 1729) SpO2:  [94 %-100 %] 99 % (02/20 1428) Weight:  [139 lb 14.4 oz (63.458 kg)] 139 lb 14.4 oz (63.458 kg) (02/19 2000)  General - Well nourished, well developed, in no apparent distress.  Ophthalmologic - Sharp disc margins OU.  Cardiovascular - Regular rate and rhythm with no murmur.  Mental Status -  Level of arousal and orientation to time, place, and person were intact. Language including expression, naming, repetition, comprehension was assessed and found intact.  Cranial Nerves II - XII - II - Visual field intact OU. III, IV, VI - Extraocular movements intact. V - Facial sensation intact bilaterally. VII - Facial movement intact bilaterally. VIII - Hearing & vestibular intact bilaterally. X - Palate elevates symmetrically. XI - Chin turning & shoulder shrug intact bilaterally. XII - Tongue protrusion intact.  Motor Strength - The patient's strength was normal in all extremities and pronator drift was absent.  Bulk was normal and fasciculations were absent.   Motor Tone - Muscle tone was assessed at the neck and appendages and was normal.  Reflexes - The patient's reflexes were normal in all extremities and she had no pathological reflexes.  Sensory - Light touch, temperature/pinprick, vibration and proprioception, and Romberg testing were assessed and were normal.    Coordination  - The patient had normal movements in the hands and feet with no ataxia or dysmetria.  Tremor was absent.  Gait and Station - The patient's transfers, posture, gait, station, and turns were observed as normal.   ASSESSMENT/PLAN Ms. Sylvia Arellano is a 44 y.o. female with history of tobacco use, cocaine use, previous seizures, and previous suicide attempts presenting with loss of consciousness with subsequent confusion, dizziness and slurred speech.  Her blood pressure was significantly elevated on admission  She did not receive IV t-PA due to resolution of her deficits.   Hypertensive encephalopathy  - likely due to cocaine use   Resultant  resolution of deficits   MRI  - negative  MRA  negative   Carotid unremarkable  2D Echo - unremarkable  LDL - 68   HgbA1c pending  Subcutaneous heparin for VTE prophylaxis  Diet Heart with thin  liquids  no antithrombotic prior to admission, now on aspirin 325  mg orally every day  Patient counseled to be compliant with her antithrombotic medications  Ongoing aggressive stroke risk factor management  Therapy recommendations: Pending   Disposition: Pending   Hypertension/hypotension  No home meds  Blood pressure elevated at time of admission - possibly secondary to cocaine.   Currently hypotension - given IV bolus  Close monitoring  ? Seizure disorder  Could be related to cocaine use  No EEG in the past  No AEDs in the past  Questionable if there is any pseudo seizure  EEG pending  No AEDs for now  Bipolar disorder  On depakote, prozac and gabapentin  Follows with Dr. Joanne Arellano  Resume home meds  Cocaine abuse  Use every the other day, $50 each  Utox positive for cocaine  May explain her high BP, LOC, "seizure"  Cessation counseling provided  Tobacco abuse  Current smoker  Smoking cessation counseling provided  Other Stroke Risk Factors  ETOH use  Other Active  Problems  Hypokalemia  Other Pertinent History  Bipolar disorder with previous suicide attempts   PLAN  EEG  Hydrate for hypotension  Hospital day # 1  Delton See PA-C [facility] Pager [phone] 07/10/2014, 8:02 AM  I, the attending vascular neurologist, have personally obtained a history, examined the patient, evaluated laboratory data, individually viewed imaging studies and agree with radiology interpretations. I also discussed with Dr. Sunnie Nielsen regarding her care plan. Together with the NP/PA, we formulated the assessment and plan of care which reflects our mutual decision.  I have made any additions or clarifications directly to the above note and agree with the findings and plan as currently documented.   44 yo F with PMH of bipolar disorder, "seizure" presented with passing out after taking cocaine. Her episode could well be related to her cocaine use and resulted hypertensive encephlopathy. Stroke work up so far all negative. Due to previous questionable seizure history (not on AEDs, no EEG done in the past), will do EEG. She also has hypotension, will give IV bolus. Encouraged to abstain from cocaine, smoking, alcohol.   Marvel Plan, MD PhD Stroke Neurology 07/10/2014 5:39 PM        To contact Stroke Continuity provider, please refer to [email or web address]. After hours, contact General Neurology

## 2014-07-10 NOTE — Progress Notes (Signed)
SLP Cancellation Note  Patient Details Name: Gershon Musseloneka S Strike MRN: [id number] DOB: 12-08-1970   Cancelled treatment:       Reason Eval/Treat Not Completed: Patient at procedure or test/unavailable;Patient declined, no reason specified   ADAMS,PAT, M.S., CCC-SLP 07/10/2014, 1:21 PM

## 2014-07-10 NOTE — Social Work (Signed)
CSW met with patient and daughter. Patient preferred that information be shared with daughter, because she was very sleepy. CSW provided patient and daughter a list of substance abuse treatment options. CSW encouraged patient and daughter to discuss with those facilities admission requirements in order to support patient providing options for ongoing care and treatment.    J  MSW, LCSW [phone] 

## 2014-07-10 NOTE — Progress Notes (Signed)
PT Cancellation Note  Patient Details Name: Sylvia Arellano MRN: [id number] DOB: 1970-10-14   Cancelled Treatment:    Reason Eval/Treat Not Completed: Patient declined, no reason specified "I am so sleepy, everyone keeps coming in and waking me up." Pt declined PT evaluation today. Will follow up next available time.   [name], [name] 07/10/2014, 2:45 PM [name], PT, DPT [phone]

## 2014-07-10 NOTE — Progress Notes (Signed)
EEG not able to be done at this time. Pt is off the floor.

## 2014-07-11 LAB — CORTISOL: Cortisol, Plasma: 2.6 ug/dL

## 2014-07-11 LAB — BASIC METABOLIC PANEL
ANION GAP: 4 — AB (ref 5–15)
BUN: 8 mg/dL (ref 6–23)
CO2: 21 mmol/L (ref 19–32)
CREATININE: 0.83 mg/dL (ref 0.50–1.10)
Calcium: 8.1 mg/dL — ABNORMAL LOW (ref 8.4–10.5)
Chloride: 111 mmol/L (ref 96–112)
GFR calc Af Amer: >90 mL/min (ref >90)
GFR, EST NON AFRICAN AMERICAN: 86 mL/min — AB (ref >90)
Glucose, Bld: 104 mg/dL — ABNORMAL HIGH (ref 70–99)
POTASSIUM: 4 mmol/L (ref 3.5–5.1)
SODIUM: 136 mmol/L (ref 135–145)

## 2014-07-11 LAB — TSH: TSH: 2.26 u[IU]/mL (ref 0.350–4.500)

## 2014-07-11 MED ORDER — SODIUM CHLORIDE 0.9 % IV BOLUS (SEPSIS)
1000.0000 mL | Freq: Once | INTRAVENOUS | Status: AC
  Administered 2014-07-11: 1000 mL via INTRAVENOUS

## 2014-07-11 MED ORDER — SODIUM CHLORIDE 0.9 % IV BOLUS (SEPSIS): 500.0000 mL | Freq: Once | INTRAVENOUS | Status: AC

## 2014-07-11 MED ORDER — SODIUM CHLORIDE 0.9 % IV BOLUS (SEPSIS)
500.0000 mL | Freq: Once | INTRAVENOUS | Status: AC
  Administered 2014-07-11: 500 mL via INTRAVENOUS

## 2014-07-11 MED ORDER — HYDROXYZINE PAMOATE 50 MG PO CAPS
50.0000 mg | ORAL_CAPSULE | Freq: Two times a day (BID) | ORAL | Status: DC
  Administered 2014-07-11: 50 mg via ORAL
  Filled 2014-07-11 (×3): qty 1

## 2014-07-11 NOTE — Progress Notes (Signed)
[facility] PROGRESS NOTE  Sylvia Arellano ZOX:[id number] DOB: 29-Sep-1970 DOA: 07/09/2014 PCP: No primary care provider on file.  Assessment/Plan: TIA:  Recent cocaine use.  MRI negative. ECHO and doppler no significant abnormalities.  LDL 68, Hb-A1c pending.  Continue with aspirin.  EEG ordered.   Hypotension: IV bolus, IV fluids.  Lactic acid negative,. No leukocytosis or fever.  Cortisol was drawn in the afternoon. Will repeat cortisol in am.   Syncope;  Probably related to drugs. Vs hypotension.  ECHO with normal Ef.  Doppler no significant stenosis.   Hypertensive urgency; likely in setting of cocaine use.   now with hypotension.   Cocaine abuse;  SW consulted patient interested in rehab.   History of seizure; continue with Depakote.   Alcohol abuse, daily use CIWA protocol.   Bipolar 2 disorder with anxiety component -Continue home medications of Depakote and Neurontin, Prozac and hydroxyzine  Code Status: Full Code.  Family Communication: Care discussed with daughter.  Disposition Plan: remain inpatient   Consultants:  neuro  Procedures:  ECHO; no source of embolism, normal EF  Doppler; Carotid Duplex (Doppler) has been completed. Findings suggest 1-39% internal carotid artery stenosis bilaterally. Vertebral arteries are patent with antegrade flow.  Antibiotics:  none  HPI/Subjective: Patient sleepy but wake up answer questions. She relates that we don't let her sleep.  She relates that her daughter spoke with SW regarding rehab.   Objective: Filed Vitals:   07/11/14 1324  BP: 114/49  Pulse: 72  Temp: 97.8 F (36.6 C)  Resp: 18   No intake or output data in the 24 hours ending 07/11/14 1344 Filed Weights   07/09/14 2000  Weight: 63.458 kg (139 lb 14.4 oz)    Exam:   General:  Alert in no distress.  Cardiovascular: S 1, S 2 RRR  Respiratory: CTA  Abdomen: BS present, soft, nt  Musculoskeletal:trace edema.    Data  Reviewed: Basic Metabolic Panel:  Recent Labs Lab 07/09/14 1212 07/09/14 1224 07/10/14 0700  NA 134* 135 137  K 3.4* 3.4* 3.7  CL 99 100 106  CO2 18*  --  25  GLUCOSE 171* 175* 141*  BUN CREATININE 1.13* 1.00 1.16*  CALCIUM 9.2  --  10.0   Liver Function Tests:  Recent Labs Lab 07/09/14 1212  AST 40*  ALT 20  ALKPHOS 71  BILITOT 0.6  PROT 8.2  ALBUMIN 4.4   No results for input(s): LIPASE, AMYLASE in the last 168 hours. No results for input(s): AMMONIA in the last 168 hours. CBC:  Recent Labs Lab 07/09/14 1212 07/09/14 1224 07/10/14 0700  WBC 5.6  --  4.4  NEUTROABS 2.3  --   --   HGB 11.8* 13.9 11.1*  HCT 35.5* 41.0 33.8*  MCV 82.2  --  81.4  PLT 282  --  260   Cardiac Enzymes: No results for input(s): CKTOTAL, CKMB, CKMBINDEX, TROPONINI in the last 168 hours. BNP (last 3 results) No results for input(s): BNP in the last 8760 hours.  ProBNP (last 3 results) No results for input(s): PROBNP in the last 8760 hours.  CBG: No results for input(s): GLUCAP in the last 168 hours.  No results found for this or any previous visit (from the past 240 hour(s)).   Studies: Dg Chest 2 View  07/10/2014   CLINICAL DATA:  Cough, congestion for 1 month  EXAM: CHEST  2 VIEW  COMPARISON:  None.  FINDINGS: Cardiomediastinal silhouette is unremarkable. No  acute infiltrate or pleural effusion. No pulmonary edema. Bony thorax is unremarkable.  IMPRESSION: No active cardiopulmonary disease.   Electronically Signed   By: Natasha MeadLiviu  Pop M.D.   On: 07/10/2014 10:49   Mr Maxine GlennMra Head Wo Contrast  07/09/2014   CLINICAL DATA:  TIA. Acute onset slurred speech and confusion today. Hypertension. Cocaine use today.  EXAM: MRI HEAD WITHOUT CONTRAST  MRA HEAD WITHOUT CONTRAST  TECHNIQUE: Multiplanar, multiecho pulse sequences of the brain and surrounding structures were obtained without intravenous contrast. Angiographic images of the head were obtained using MRA technique without  contrast.  COMPARISON:  CT head 07/09/2014  FINDINGS: MRI HEAD FINDINGS  Ventricle size is normal. Cerebral volume is normal craniocervical junction normal. Pituitary normal in size.  Negative for acute or chronic infarct  Negative for demyelinating disease.  Negative for intracranial hemorrhage  Negative for mass or edema.  Paranasal sinuses are clear.  MRA HEAD FINDINGS  Vertebral artery is patent bilaterally. PICA patent bilaterally. Basilar widely patent. Superior cerebellar and posterior cerebral artery is patent bilaterally. Posterior communicating artery is patent bilaterally.  Internal carotid artery widely patent bilaterally. Anterior and middle cerebral arteries are patent bilaterally without stenosis  Negative for cerebral aneurysm.  IMPRESSION: Negative MRI and MRA of the head.   Electronically Signed   By: Marlan Palauharles  Clark M.D.   On: 07/09/2014 18:47   Mr Brain Wo Contrast  07/09/2014   CLINICAL DATA:  TIA. Acute onset slurred speech and confusion today. Hypertension. Cocaine use today.  EXAM: MRI HEAD WITHOUT CONTRAST  MRA HEAD WITHOUT CONTRAST  TECHNIQUE: Multiplanar, multiecho pulse sequences of the brain and surrounding structures were obtained without intravenous contrast. Angiographic images of the head were obtained using MRA technique without contrast.  COMPARISON:  CT head 07/09/2014  FINDINGS: MRI HEAD FINDINGS  Ventricle size is normal. Cerebral volume is normal craniocervical junction normal. Pituitary normal in size.  Negative for acute or chronic infarct  Negative for demyelinating disease.  Negative for intracranial hemorrhage  Negative for mass or edema.  Paranasal sinuses are clear.  MRA HEAD FINDINGS  Vertebral artery is patent bilaterally. PICA patent bilaterally. Basilar widely patent. Superior cerebellar and posterior cerebral artery is patent bilaterally. Posterior communicating artery is patent bilaterally.  Internal carotid artery widely patent bilaterally. Anterior and middle  cerebral arteries are patent bilaterally without stenosis  Negative for cerebral aneurysm.  IMPRESSION: Negative MRI and MRA of the head.   Electronically Signed   By: Marlan Palauharles  Clark M.D.   On: 07/09/2014 18:47    Scheduled Meds: . aspirin  300 mg Rectal Daily   Or  . aspirin  325 mg Oral Daily  . atorvastatin  20 mg Oral q1800  . divalproex  1,000 mg Oral QHS  . FLUoxetine  20 mg Oral BID  . folic acid  1 mg Oral Daily  . gabapentin  1,200 mg Oral TID  . heparin  5,000 Units Subcutaneous 3 times per day  . hydrOXYzine  100 mg Oral BID  . LORazepam  0-4 mg Oral Q6H   Followed by  . LORazepam  0-4 mg Oral Q12H  . multivitamin with minerals  1 tablet Oral Daily  . sodium chloride  500 mL Intravenous Once  . thiamine  100 mg Oral Daily   Continuous Infusions: . sodium chloride 125 mL/hr at 07/11/14 [id number]    Principal Problem:   TIA (transient ischemic attack) Active Problems:   Hypertensive urgency   Cocaine abuse   Alcohol abuse,  daily use   Tobacco abuse   History of seizure   Bipolar 2 disorder   Hypokalemia   Cocaine use   Encephalopathy, hypertensive    Time spent: 25 minutes.     Hartley Barefoot A  [facility] Pager [phone]. If 7PM-7AM, please contact night-coverage at [email or web address], password [facility] 07/11/2014, 1:44 PM  LOS: 2 days

## 2014-07-11 NOTE — Progress Notes (Signed)
Utilization Review Completed.   Rilei Kravitz, RN, BSN Nurse Case Manager  

## 2014-07-11 NOTE — Progress Notes (Signed)
OT Cancellation Note  Patient Details Name: Gershon Musseloneka S Wolman MRN: [id number] DOB: November 17, 1970   Cancelled Treatment:    Reason Eval/Treat Not Completed: OT screened, no needs identified, will sign off. Per PT, pt at baseline and has no needs. Acute OT to sign off.   Nena JordanMiller, Amazin Pincock M   Carney LivingLeeAnn Marie Toa Mia, OTR/L Occupational Therapist [phone] (pager)  07/11/2014, 11:27 AM

## 2014-07-11 NOTE — Progress Notes (Signed)
STROKE TEAM PROGRESS NOTE   HISTORY Sylvia Arellano is a 44 y.o. female with a history of bipolar affective disorder and seizures who was brought to the emergency room and code stroke status: The onset of dizziness and slurred speech as well as confusion at 11:15 AM today. She has no previous history of stroke nor TIA. She has not been on antiplatelet therapy. Pressure initially was markedly elevated at 280/120. Blood pressure improved in the emergency room without intervention with her last blood pressure being 140/98. CT scan of her head showed no acute intracranial abnormality. NIH stroke score was 0. Mental status changes resolved, as did slurred speech. She had no focal weakness nor numbness.  LSN: 11:15 AM on 07/09/2014 TPA Given: No: Deficits resolved  SUBJECTIVE (INTERVAL HISTORY) The patient had a friend at bedside. The patient states she was walking in a friend's house when she became dizzy, fell and had loss of consciousness. She denies any palpitations or chest discomfort. The friend stated that at she was on for but did not lose consciousness, but shaking versus shivering. She admits she has used cocaine the same day, however she cannot remember how long between the cocaine use and the episode. EMS was summoned and she was brought in as a code stroke. In ER, her blood pressure super high, however improved without intervention. She denies recent stress. She does have bipolar disorder and is followed by her psychiatrist Dr. Joanne CharsJennifer Jackson.   She admits that she uses cocaine every other day, current smoker and alcohol user.  She stated that she has seizure in the past, was told to passed out with shaking, lasting minutes. Last seizure many years ago. However, she has not had any EEG or seizure medications. She stated that she might have seizure this time. EEG was not able to be done yesterday.   Her blood pressure was low yesterday, was giving IV bolus and IV fluid. However today her blood  pressure is still running around 90-100.  OBJECTIVE Temp:  [97.8 F (36.6 C)-98.8 F (37.1 C)] 97.8 F (36.6 C) (02/21 1324) Pulse Rate:  [69-79] 72 (02/21 1324) Cardiac Rhythm:  [-] Normal sinus rhythm (02/21 0800) Resp:  [18] 18 (02/21 1324) BP: (86-114)/(34-71) 114/49 mmHg (02/21 1324) SpO2:  [97 %-100 %] 100 % (02/21 1324)  No results for input(s): GLUCAP in the last 168 hours.  Recent Labs Lab 07/09/14 1212 07/09/14 1224 07/10/14 0700  NA 134* 135 137  K 3.4* 3.4* 3.7  CL 99 100 106  CO2 18*  --  25  GLUCOSE 171* 175* 141*  BUN 10 10 15   CREATININE 1.13* 1.00 1.16*  CALCIUM 9.2  --  10.0    Recent Labs Lab 07/09/14 1212  AST 40*  ALT 20  ALKPHOS 71  BILITOT 0.6  PROT 8.2  ALBUMIN 4.4    Recent Labs Lab 07/09/14 1212 07/09/14 1224 07/10/14 0700  WBC 5.6  --  4.4  NEUTROABS 2.3  --   --   HGB 11.8* 13.9 11.1*  HCT 35.5* 41.0 33.8*  MCV 82.2  --  81.4  PLT 282  --  260   No results for input(s): CKTOTAL, CKMB, CKMBINDEX, TROPONINI in the last 168 hours.  Recent Labs  07/09/14 1212  LABPROT 15.3*  INR 1.20    Recent Labs  07/09/14 1347  COLORURINE YELLOW  LABSPEC 1.019  PHURINE 6.0  GLUCOSEU NEGATIVE  HGBUR NEGATIVE  BILIRUBINUR NEGATIVE  KETONESUR NEGATIVE  PROTEINUR 30*  UROBILINOGEN  0.2  NITRITE NEGATIVE  LEUKOCYTESUR NEGATIVE       Component Value Date/Time   CHOL 157 07/10/2014 0700   TRIG 79 07/10/2014 0700   HDL 73 07/10/2014 0700   CHOLHDL 2.2 07/10/2014 0700   VLDL 16 07/10/2014 0700   LDLCALC 68 07/10/2014 0700   No results found for: HGBA1C    Component Value Date/Time   LABOPIA NONE DETECTED 07/09/2014 1347   COCAINSCRNUR POSITIVE* 07/09/2014 1347   LABBENZ NONE DETECTED 07/09/2014 1347   AMPHETMU NONE DETECTED 07/09/2014 1347   THCU NONE DETECTED 07/09/2014 1347   LABBARB NONE DETECTED 07/09/2014 1347     Recent Labs Lab 07/09/14 1212  ETH 11*    2-D echocardiogram 07/09/2014 Study Conclusions -  Left ventricle: The cavity size was normal. Wall thickness was normal. The estimated ejection fraction was 60%. Wall motion was normal; there were no regional wall motion abnormalities. - Right ventricle: The cavity size was normal. Systolic function was normal. - Impressions: No cardiac source of embolism was identified, but cannot be ruled out on the basis of this examination.  Impressions: - No cardiac source of embolism was identified, but cannot be ruled out on the basis of this examination.  Ct Head Wo Contrast 07/09/2014    Study within normal limits. No intracranial mass, hemorrhage, or focal gray lesions/acute appearing infarct.    Mr Shirlee Latch Wo Contrast 07/09/2014    Negative MRI and MRA of the head.    Chest x-ray two-view  07/10/2014 No active cardiopulmonary disease.  CUS - Bilateral: 1-39% ICA stenosis. Vertebral artery flow is antegrade.  EEG - pending  PHYSICAL EXAM  Temp:  [97.8 F (36.6 C)-98.8 F (37.1 C)] 97.8 F (36.6 C) (02/21 1324) Pulse Rate:  [69-79] 72 (02/21 1324) Resp:  [18] 18 (02/21 1324) BP: (86-114)/(34-71) 114/49 mmHg (02/21 1324) SpO2:  [97 %-100 %] 100 % (02/21 1324)  General - Well nourished, well developed, sleepy drowsy during rounds.  Ophthalmologic - Sharp disc margins OU.  Cardiovascular - Regular rate and rhythm with no murmur.  Mental Status -  Sleepy drowsy, but orientated to time, place, and person were intact. Language including expression, naming, repetition, comprehension was assessed and found intact. Moderate psychomotor slowing.  Cranial Nerves II - XII - II - Visual field intact OU. III, IV, VI - Extraocular movements intact. V - Facial sensation intact bilaterally. VII - Facial movement intact bilaterally. VIII - Hearing & vestibular intact bilaterally. X - Palate elevates symmetrically. XI - Chin turning & shoulder shrug intact bilaterally. XII - Tongue protrusion intact.  Motor Strength - The  patient's strength was normal in all extremities and pronator drift was absent.  Bulk was normal and fasciculations were absent.   Motor Tone - Muscle tone was assessed at the neck and appendages and was normal.  Reflexes - The patient's reflexes were normal in all extremities and she had no pathological reflexes.  Sensory - Light touch, temperature/pinprick, vibration and proprioception, and Romberg testing were assessed and were normal.    Coordination - The patient had normal movements in the hands and feet with no ataxia or dysmetria.  Tremor was absent.  Gait and Station - The patient's transfers, posture, gait, station, and turns were observed as normal.   ASSESSMENT/PLAN Sylvia Arellano is a 44 y.o. female with history of tobacco use, cocaine use, ? previous seizures, and previous suicide attempts presenting with loss of consciousness with subsequent confusion, dizziness and slurred speech.  Her  blood pressure was significantly elevated on admission, but improved without intervention ER. During admission her blood pressure was low, on IV fluid with bolus.  She did not receive IV t-PA due to resolution of her deficits.   Hypertensive encephalopathy  - likely due to cocaine use   Resultant  resolution of deficits   MRI  - negative  MRA  negative   Carotid unremarkable  2D Echo - unremarkable  LDL - 68   HgbA1c pending  Subcutaneous heparin for VTE prophylaxis  Diet Heart with thin  liquids  no antithrombotic prior to admission, now on aspirin 325 mg orally every day  Patient counseled to be compliant with her antithrombotic medications  Ongoing aggressive stroke risk factor management  Therapy recommendations: Pending   Disposition: Pending   Hypertension/hypotension  No home meds  Blood pressure elevated at time of admission - possibly secondary to cocaine.   Currently hypotension - IV fluids  Close monitoring  ? Seizure vs. Convulsive syncope  Could  be related to cocaine use  No EEG in the past  No AEDs in the past  Questionable if there is any pseudo seizure  EEG pending  No AEDs for now  Bipolar disorder  On depakote, prozac and gabapentin  Follows with Dr. Joanne Chars  Resume home meds  Cocaine abuse  Use every the other day, $50 each  Utox positive for cocaine  May explain her high BP, LOC, "seizure"  Cessation counseling provided  Recommend to check HIV, RPR.   Tobacco abuse  Current smoker  Smoking cessation counseling provided  Other Stroke Risk Factors  ETOH user  Other Active Problems  Hypokalemia  Other Pertinent History  Bipolar disorder with previous suicide attempts   Hospital day # 2  Hassel Neth [facility] Pager [phone] 07/11/2014, 3:06 PM  44 yo F with PMH of bipolar disorder, "seizure" presented with passing out after taking cocaine. Her episode could well be related to her cocaine use and resulted hypertensive encephlopathy. Stroke work up so far all negative. Due to previous questionable seizure history (not on AEDs, no EEG done in the past), will do EEG. EEGs still pending as well as HIV and RPR. The pressure is still at low side, continue IV fluid. Encouraged to abstain from cocaine, smoking, alcohol.   Marvel Plan, MD PhD Stroke Neurology 07/11/2014 3:06 PM   To contact Stroke Continuity provider, please refer to [email or web address]. After hours, contact General Neurology

## 2014-07-11 NOTE — Evaluation (Signed)
Physical Therapy Evaluation Patient Details Name: Sylvia Arellano MRN: [id number] DOB: 1970-06-22 Today's Date: 07/11/2014   History of Present Illness  Patient is a 44 yo female admitted with slurred speech for TIA/CVA workup.  Patient with polysubstance abuse - ETOH and cocaine.  MRI negative.  Per chart, speech resolved.  PMH:  Seizures, polysubstance abuse, HTN, bipolar disorder  Clinical Impression  Patient is independent with mobility and gait.  No acute PT needs identified - PT will sign off.  Encouraged ambulation in hallway.    Follow Up Recommendations No PT follow up;Supervision - Intermittent    Equipment Recommendations  None recommended by PT    Recommendations for Other Services       Precautions / Restrictions Precautions Precautions: None Restrictions Weight Bearing Restrictions: No      Mobility  Bed Mobility Overal bed mobility: Independent             General bed mobility comments: Independently donned socks with good balance.  Transfers Overall transfer level: Independent Equipment used: None                Ambulation/Gait Ambulation/Gait assistance: Independent Ambulation Distance (Feet): 300 Feet Assistive device: None Gait Pattern/deviations: WFL(Within Functional Limits) Gait velocity: Decreased Gait velocity interpretation: Below normal speed for age/gender General Gait Details: Patient with slower gait pattern.  Good balance with gait.  Stairs            Wheelchair Mobility    Modified Rankin (Stroke Patients Only) Modified Rankin (Stroke Patients Only) Pre-Morbid Rankin Score: No symptoms Modified Rankin: No symptoms     Balance Overall balance assessment: Independent                           High level balance activites: Direction changes;Turns;Sudden stops;Head turns High Level Balance Comments: No loss of balance with high level balance activities             Pertinent Vitals/Pain Pain  Assessment: No/denies pain    Home Living Family/patient expects to be discharged to:: Private residence Living Arrangements: Spouse/significant other Available Help at Discharge: Friend(s);Family;Available PRN/intermittently Type of Home: Apartment Home Access: Stairs to enter Entrance Stairs-Rails: Right Entrance Stairs-Number of Steps: 7 Home Layout: One level Home Equipment: None      Prior Function Level of Independence: Independent               Hand Dominance        Extremity/Trunk Assessment   Upper Extremity Assessment: Overall WFL for tasks assessed           Lower Extremity Assessment: Overall WFL for tasks assessed      Cervical / Trunk Assessment: Normal  Communication   Communication: No difficulties  Cognition Arousal/Alertness: Lethargic (Easily arousable) Behavior During Therapy: Flat affect Overall Cognitive Status: Within Functional Limits for tasks assessed                      General Comments      Exercises        Assessment/Plan    PT Assessment Patent does not need any further PT services  PT Diagnosis Abnormality of gait   PT Problem List    PT Treatment Interventions     PT Goals (Current goals can be found in the Care Plan section) Acute Rehab PT Goals PT Goal Formulation: All assessment and education complete, DC therapy    Frequency  Barriers to discharge        Co-evaluation               End of Session Equipment Utilized During Treatment: Gait belt Activity Tolerance: Patient tolerated treatment well Patient left: in bed;with call bell/phone within reach;with bed alarm set;with family/visitor present Nurse Communication: Mobility status         Time: 1010-1024 PT Time Calculation (min) (ACUTE ONLY): 14 min   Charges:   PT Evaluation $Initial PT Evaluation Tier I: 1 Procedure     PT G CodesVena Austria 2014/07/26, 10:39 AM Durenda Hurt. Renaldo Fiddler, [facility] Acute Rehab  Services Pager [phone]

## 2014-07-12 ENCOUNTER — Ambulatory Visit (HOSPITAL_COMMUNITY): Payer: Self-pay

## 2014-07-12 DIAGNOSIS — I674 Hypertensive encephalopathy: Principal | ICD-10-CM

## 2014-07-12 LAB — BASIC METABOLIC PANEL
Anion gap: 4 — ABNORMAL LOW (ref 5–15)
CO2: 22 mmol/L (ref 19–32)
Calcium: 7.8 mg/dL — ABNORMAL LOW (ref 8.4–10.5)
Chloride: 109 mmol/L (ref 96–112)
Creatinine, Ser: 0.83 mg/dL (ref 0.50–1.10)
GFR calc Af Amer: >90 mL/min (ref >90)
GFR, EST NON AFRICAN AMERICAN: 86 mL/min — AB (ref >90)
GLUCOSE: 89 mg/dL (ref 70–99)
Potassium: 3.9 mmol/L (ref 3.5–5.1)
Sodium: 135 mmol/L (ref 135–145)

## 2014-07-12 LAB — HIV ANTIBODY (ROUTINE TESTING W REFLEX): HIV Screen 4th Generation wRfx: NONREACTIVE

## 2014-07-12 LAB — RPR: RPR: NONREACTIVE

## 2014-07-12 LAB — HEMOGLOBIN A1C
HEMOGLOBIN A1C: 5.7 % — AB (ref 4.8–5.6)
Mean Plasma Glucose: 117 mg/dL

## 2014-07-12 MED ORDER — HYDROXYZINE PAMOATE 50 MG PO CAPS: 50.0000 mg | ORAL_CAPSULE | Freq: Two times a day (BID) | ORAL | Status: DC

## 2014-07-12 MED ORDER — HYDROXYZINE HCL 25 MG PO TABS: 50.0000 mg | ORAL_TABLET | Freq: Four times a day (QID) | ORAL | Status: DC | PRN

## 2014-07-12 NOTE — Discharge Summary (Addendum)
Physician Discharge Summary  Sylvia Arellano AVW:[id number] DOB: 06/17/1970 DOA: 07/09/2014  PCP: No primary care provider on file.  Admit date: 07/09/2014 Discharge date: 07/12/2014  Time spent: 35 minutes  Recommendations for Outpatient Follow-up:  Patient sign against medical advised. Patient alert, oriented time 3, understand risk of leaving AMA.  Labs pending: cortisol level,. Resource for outpatient rehab provide to patient daughter.   Discharge Diagnoses:    TIA (transient ischemic attack)   Seizure, vs convulsive syncope.    Hypertensive urgency   Cocaine abuse   Alcohol abuse, daily use   Tobacco abuse   History of seizure   Bipolar 2 disorder   Hypokalemia   Cocaine use   Encephalopathy, hypertensive   Discharge Condition: stable.   Diet recommendation: regular.    Filed Weights   07/09/14 2000  Weight: 63.458 kg (139 lb 14.4 oz)    History of present illness:  Sylvia Arellano is a 44 y.o. female, the past medical history of bipolar affective disorder as well as seizures. Brought to the ER due to acute complaints of new onset slurred speech associated with dizziness and apparent confusion. According to the documentation from the neurologist the patient developed the symptoms around 11:15 AM today. Patient has no prior history of stroke or TIA and has not required antiplatelet therapy for any diagnoses. When she presented to the emergency department her blood pressure was markedly elevated at 280/120. Her blood pressure improved after arrival to the ER to 140/98 without any intervention or medication administration. CT of the head revealed no acute intra-or no abnormality. Her NIH stroke score was 0. Mother time the neurologist to evaluate the patient her mental status changes had resolved including her slurred speech and she was noted to have no focal weakness or numbness. She later admitted to the EDP that she had been using cocaine today prior to coming to the ER. She also  admitted to daily use of alcohol at least 3-4 beers per day.  Upon my evaluation, the nurse reported that the patient's slurred speech and difficulty speaking had returned. When I evaluated the patient she was having some difficulty with word finding and apparent mild expressive aphasia and also perseveration some of her words. MRI/MRA head has been ordered stat. Patient has passed her swallowing evaluation. She confirms with me her regular use of cocaine and alcohol. 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 She states she is attempting to apply for disability.  Hospital Course:  TIA:  Recent cocaine use.  MRI negative. ECHO and doppler no significant abnormalities.  LDL 68, Hb-A1c pending.  Continue with aspirin.  EEG ordered.   Hypotension: IV bolus, IV fluids.  Lactic acid negative,. No leukocytosis or fever.  Cortisol was drawn in the afternoon. Will repeat cortisol in am.   Syncope; vs convulsive syncope.  Probably related to drugs. Vs hypotension.  ECHO with normal Ef.  Doppler no significant stenosis.   Hypertensive urgency; likely in setting of cocaine use.  now with hypotension.   Cocaine abuse;  SW consulted patient interested in rehab.   History of seizure; continue with Depakote.   Alcohol abuse, daily use CIWA protocol.   Bipolar 2 disorder with anxiety component -Continue home medications of Depakote and Neurontin, Prozac and hydroxyzine  Procedures:  Unable to perform EEG.   Doppler: Findings suggest 1-39% internal carotid artery stenosis bilaterally. Vertebral arteries are  patent with antegrade flow.  ECHO; - No cardiac source of embolism was identified, but cannot be ruled out on the basis of this examination.  Consultations:  neurology  Discharge Exam: Filed Vitals:   07/12/14 1004  BP: 114/74  Pulse: 73  Temp: 98.1 F  (36.7 C)  Resp: 18    General: Alert in no distress.  Cardiovascular: S 1, S 2 RRR Respiratory: CTA Neuro; alert , oriented times 3. Following command.   Discharge Instructions    Current Discharge Medication List    CONTINUE these medications which have NOT CHANGED   Details  divalproex (DEPAKOTE ER) 500 MG 24 hr tablet Take 1,000 mg by mouth at bedtime.    FLUoxetine (PROZAC) 20 MG capsule Take 20 mg by mouth 2 (two) times daily.    gabapentin (NEURONTIN) 400 MG capsule Take 1,200 mg by mouth 3 (three) times daily.    HYDROXYZINE PAMOATE PO Take 100 mg by mouth 2 (two) times daily.        No Known Allergies    The results of significant diagnostics from this hospitalization (including imaging, microbiology, ancillary and laboratory) are listed below for reference.    Significant Diagnostic Studies: Dg Chest 2 View  07/10/2014   CLINICAL DATA:  Cough, congestion for 1 month  EXAM: CHEST  2 VIEW  COMPARISON:  None.  FINDINGS: Cardiomediastinal silhouette is unremarkable. No acute infiltrate or pleural effusion. No pulmonary edema. Bony thorax is unremarkable.  IMPRESSION: No active cardiopulmonary disease.   Electronically Signed   By: Natasha Mead M.D.   On: 07/10/2014 10:49   Ct Head Wo Contrast  07/09/2014   CLINICAL DATA:  Acute onset slurred speech and confusion; dizziness  EXAM: CT HEAD WITHOUT CONTRAST  TECHNIQUE: Contiguous axial images were obtained from the base of the skull through the vertex without intravenous contrast.  COMPARISON:  February 11, 2003  FINDINGS: The ventricles are normal in size and configuration. There is no mass, hemorrhage, extra-axial fluid collection, or midline shift. Gray and white compartments appear normal. No acute infarct apparent. Middle cerebral arteries appear symmetric and unremarkable bilaterally. Bony calvarium appears intact. The mastoid air cells are clear.  IMPRESSION: Study within normal limits. No intracranial mass, hemorrhage,  or focal gray lesions/acute appearing infarct.  Critical Value/emergent results were called by telephone at the time of interpretation on 07/09/2014 at 12:33 pm to Dr. Roseanne Reno, neurology, who verbally acknowledged these results.   Electronically Signed   By: Bretta Bang III M.D.   On: 07/09/2014 12:33   Mr Maxine Glenn Head Wo Contrast  07/09/2014   CLINICAL DATA:  TIA. Acute onset slurred speech and confusion today. Hypertension. Cocaine use today.  EXAM: MRI HEAD WITHOUT CONTRAST  MRA HEAD WITHOUT CONTRAST  TECHNIQUE: Multiplanar, multiecho pulse sequences of the brain and surrounding structures were obtained without intravenous contrast. Angiographic images of the head were obtained using MRA technique without contrast.  COMPARISON:  CT head 07/09/2014  FINDINGS: MRI HEAD FINDINGS  Ventricle size is normal. Cerebral volume is normal craniocervical junction normal. Pituitary normal in size.  Negative for acute or chronic infarct  Negative for demyelinating disease.  Negative for intracranial hemorrhage  Negative for mass or edema.  Paranasal sinuses are clear.  MRA HEAD FINDINGS  Vertebral artery is patent bilaterally. PICA patent bilaterally. Basilar widely patent. Superior cerebellar and posterior cerebral artery is patent bilaterally. Posterior communicating artery is patent bilaterally.  Internal carotid artery widely patent bilaterally. Anterior and middle cerebral arteries are patent  bilaterally without stenosis  Negative for cerebral aneurysm.  IMPRESSION: Negative MRI and MRA of the head.   Electronically Signed   By: Marlan Palauharles  Clark M.D.   On: 07/09/2014 18:47   Mr Brain Wo Contrast  07/09/2014   CLINICAL DATA:  TIA. Acute onset slurred speech and confusion today. Hypertension. Cocaine use today.  EXAM: MRI HEAD WITHOUT CONTRAST  MRA HEAD WITHOUT CONTRAST  TECHNIQUE: Multiplanar, multiecho pulse sequences of the brain and surrounding structures were obtained without intravenous contrast. Angiographic  images of the head were obtained using MRA technique without contrast.  COMPARISON:  CT head 07/09/2014  FINDINGS: MRI HEAD FINDINGS  Ventricle size is normal. Cerebral volume is normal craniocervical junction normal. Pituitary normal in size.  Negative for acute or chronic infarct  Negative for demyelinating disease.  Negative for intracranial hemorrhage  Negative for mass or edema.  Paranasal sinuses are clear.  MRA HEAD FINDINGS  Vertebral artery is patent bilaterally. PICA patent bilaterally. Basilar widely patent. Superior cerebellar and posterior cerebral artery is patent bilaterally. Posterior communicating artery is patent bilaterally.  Internal carotid artery widely patent bilaterally. Anterior and middle cerebral arteries are patent bilaterally without stenosis  Negative for cerebral aneurysm.  IMPRESSION: Negative MRI and MRA of the head.   Electronically Signed   By: Marlan Palauharles  Clark M.D.   On: 07/09/2014 18:47    Microbiology: No results found for this or any previous visit (from the past 240 hour(s)).   Labs: Basic Metabolic Panel:  Recent Labs Lab 07/09/14 1212 07/09/14 1224 07/10/14 0700 07/11/14 1600 07/12/14 0420  NA 134* 135 137 136 135  K 3.4* 3.4* 3.7 4.0 3.9  CL 99 100 106 111 109  CO2 18*  --  25 21 22   GLUCOSE 171* 175* 141* 104* 89  BUN 10 10 15 8  <5*  CREATININE 1.13* 1.00 1.16* 0.83 0.83  CALCIUM 9.2  --  10.0 8.1* 7.8*   Liver Function Tests:  Recent Labs Lab 07/09/14 1212  AST 40*  ALT 20  ALKPHOS 71  BILITOT 0.6  PROT 8.2  ALBUMIN 4.4   No results for input(s): LIPASE, AMYLASE in the last 168 hours. No results for input(s): AMMONIA in the last 168 hours. CBC:  Recent Labs Lab 07/09/14 1212 07/09/14 1224 07/10/14 0700  WBC 5.6  --  4.4  NEUTROABS 2.3  --   --   HGB 11.8* 13.9 11.1*  HCT 35.5* 41.0 33.8*  MCV 82.2  --  81.4  PLT 282  --  260   Cardiac Enzymes: No results for input(s): CKTOTAL, CKMB, CKMBINDEX, TROPONINI in the last 168  hours. BNP: BNP (last 3 results) No results for input(s): BNP in the last 8760 hours.  ProBNP (last 3 results) No results for input(s): PROBNP in the last 8760 hours.  CBG: No results for input(s): GLUCAP in the last 168 hours.     SignedHartley Barefoot:  Pace Lamadrid A  [facility] 07/12/2014, 11:35 AM

## 2014-07-12 NOTE — Progress Notes (Signed)
RN came into pt's room to give pt her daily meds. Pt stated that she wanted to leave right away and that she was going to get dressed. Pt agreed to take her medications. RN paged attending MD and MD asked pt to stay until Stroke team MDs and CSW were able to see pt. Pt agreed to stay until then. Stroke team and CSW saw pt. Pt then told RN and attending MD that she was ready to leave no matter what. Pt signed AMA form. IV removed. Pt left unit with her friend who was giving a ride home.

## 2014-07-12 NOTE — Evaluation (Signed)
Speech Language Pathology Evaluation Patient Details Name: Sylvia Arellano MRN: [id number] DOB: 30-Apr-1971 Today's Date: 07/12/2014 Time: 0850-0908 SLP Time Calculation (min) (ACUTE ONLY): 18 min  Problem List:  Patient Active Problem List   Diagnosis Date Noted  . Cocaine use   . Encephalopathy, hypertensive   . TIA (transient ischemic attack) 07/09/2014  . Hypertensive urgency 07/09/2014  . Cocaine abuse 07/09/2014  . Alcohol abuse, daily use 07/09/2014  . Tobacco abuse 07/09/2014  . History of seizure 07/09/2014  . Bipolar 2 disorder 07/09/2014  . Hypokalemia 07/09/2014  . Cerebral infarction due to unspecified mechanism    Past Medical History:  Past Medical History  Diagnosis Date  . Bipolar 2 disorder   . Suicide attempt   . Seizures    Past Surgical History: History reviewed. No pertinent past surgical history. HPI:  Patient is a 44 yo female admitted with slurred speech for TIA/CVA workup. Patient with polysubstance abuse - ETOH and cocaine. MRI negative. Per chart, speech resolved. PMH: Seizures, polysubstance abuse, HTN, bipolar disorder   Assessment / Plan / Recommendation Clinical Impression  Pt demonstrated moderate dysarthria and hesitations/false starts in conversation, lethargy, decreased awareness. Dysarthria further characterized by increased rate, decreased intensity and decreased labial movement (mumbling). She states "this is how I usually sound."  Pt reports she is sleepy today. No cognitive impairments noted in PT's session 2/21; suspect pt's presentation due to lack of sleep or medication induced. In current state she is not safe to live alone, especially not care for grandchild. ST will follow to improve cognition and speech intelligibility.        SLP Assessment  Patient needs continued Speech Lanaguage Pathology Services    Follow Up Recommendations  Home health SLP    Frequency and Duration min 2x/week  2 weeks   Pertinent Vitals/Pain Pain  Assessment: No/denies pain   SLP Goals  Patient/Family Stated Goal: "I want to go home" Potential to Achieve Goals (ACUTE ONLY): Good Potential Considerations (ACUTE ONLY): Family/community support;Previous level of function (cocaine abuse)  SLP Evaluation Prior Functioning  Cognitive/Linguistic Baseline: Information not available Type of Home: Apartment  Lives With: Alone (keeps one yr old grandchild during the day) 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)   Cognition  Overall Cognitive Status: Impaired/Different from baseline Arousal/Alertness: Lethargic Orientation Level: Oriented to person;Oriented to place;Oriented to time;Oriented to situation (not recall MRI results) Attention: Sustained Sustained Attention: Impaired Sustained Attention Impairment: Verbal basic;Functional basic Memory: Impaired Memory Impairment:  (recalled info after 3 min delay,, not recall MRI results) Awareness: Impaired Awareness Impairment: Intellectual impairment;Emergent impairment;Anticipatory impairment Problem Solving: Appears intact (for verbal) Executive Function: Self Monitoring;Self Correcting Safety/Judgment: Impaired    Comprehension  Auditory Comprehension Overall Auditory Comprehension: Appears within functional limits for tasks assessed Visual Recognition/Discrimination Discrimination: Not tested Reading Comprehension Reading Status: Not tested    Expression Expression Primary Mode of Expression: Verbal Verbal Expression Overall Verbal Expression: Impaired Initiation: No impairment Level of Generative/Spontaneous Verbalization: Conversation Repetition:  (NT) Naming: No impairment Pragmatics: Impairment Impairments: Eye contact Written Expression Written Expression: Not tested   Oral / Motor Oral Motor/Sensory Function Overall Oral Motor/Sensory Function: Impaired Labial ROM: Reduced right Labial Symmetry: Abnormal symmetry right Labial Strength:  Reduced Lingual ROM: Within Functional Limits Lingual Symmetry: Within Functional Limits Facial ROM: Within Functional Limits Facial Symmetry: Within Functional Limits Motor Speech Overall Motor Speech: Impaired Respiration: Within functional limits Phonation: Normal Resonance: Within functional limits Articulation: Impaired Level of Impairment: Conversation Intelligibility: Intelligibility  reduced Word: 75-100% accurate Phrase: 75-100% accurate Sentence: 75-100% accurate Conversation: 75-100% accurate Motor Planning: Witnin functional limits Effective Techniques: Over-articulate;Slow rate   GO     Royce MacadamiaLitaker, Kajal Scalici Willis 07/12/2014, 9:27 AM   Breck CoonsLisa Willis Lonell FaceLitaker M.Ed [employer]P Pager [phone]

## 2014-07-12 NOTE — Clinical Social Work Note (Signed)
Clinical Social Worker met with patient at bedside briefly in reference to substance abuse. Patient admitted to EDP that she was using cocaine prior to arrival and uses alcohol daily.   CSW attempted to initiate psychosocial assessment and patient reported she was ready to leave [facility]. Patient fully dressed with belongings, stating she only needs information for her daughter. CSW attempted to review substance abuse and counseling resources with patient and she declined. Patient reported she would give information to her daughter who requested information to review.   Patient left AMA. Substance abuse/ counseling resources provided. Patient did not report any further concerns.   Clinical Social Worker will sign off for now as social work intervention is no longer needed. Please consult [place] again if new need arises.  Glendon Axe, MSW, LCSWA [phone] 07/12/2014 12:13 PM

## 2014-07-12 NOTE — Progress Notes (Signed)
EEG could not be completed due to patient having a weave and tech not able to get to scalp to place electrodes.. Will have to be done as outpatient when patient has weave out.

## 2014-07-13 LAB — CORTISOL-AM, BLOOD: Cortisol - AM: 4.8 ug/dL (ref 4.3–22.4)

## 2018-01-17 DIAGNOSIS — F609 Personality disorder, unspecified: Secondary | ICD-10-CM | POA: Insufficient documentation

## 2018-01-17 DIAGNOSIS — F3112 Bipolar disorder, current episode manic without psychotic features, moderate: Secondary | ICD-10-CM

## 2018-01-17 DIAGNOSIS — F1491 Cocaine use, unspecified, in remission: Secondary | ICD-10-CM | POA: Insufficient documentation

## 2018-01-17 DIAGNOSIS — F1420 Cocaine dependence, uncomplicated: Secondary | ICD-10-CM | POA: Insufficient documentation

## 2018-01-17 DIAGNOSIS — F439 Reaction to severe stress, unspecified: Secondary | ICD-10-CM | POA: Insufficient documentation

## 2018-01-17 DIAGNOSIS — F411 Generalized anxiety disorder: Secondary | ICD-10-CM | POA: Insufficient documentation

## 2018-01-17 HISTORY — DX: Bipolar disorder, current episode manic without psychotic features, moderate: F31.12

## 2020-01-28 ENCOUNTER — Telehealth (HOSPITAL_COMMUNITY): Payer: Self-pay | Admitting: *Deleted

## 2020-01-28 NOTE — Telephone Encounter (Signed)
Call from patient who recently made an appt to see a provider here for the first time. Her appt is on 10/17 with [facility]. She is out of medicine, she is unisured, and has refills available on some of her medicines at [facility] but cant afford to pick them up. Called [facility] to verify meds and doses and emailed Dr Azucena Kuba to bridge her medicines till her first appt here. Explained to patient meds would be at [facility], gave her the number and address and advised to call before going over to check on availability due to todays late hour. She expressed her appreciation.

## 2020-01-29 ENCOUNTER — Telehealth (HOSPITAL_COMMUNITY): Payer: Self-pay | Admitting: *Deleted

## 2020-02-01 NOTE — Telephone Encounter (Signed)
Opened in error, no need for further documentation. 

## 2020-02-23 ENCOUNTER — Ambulatory Visit: Payer: Self-pay | Admitting: *Deleted

## 2020-02-23 ENCOUNTER — Ambulatory Visit: Payer: MEDICAID | Attending: Critical Care Medicine

## 2020-02-23 DIAGNOSIS — Z23 Encounter for immunization: Secondary | ICD-10-CM

## 2020-02-23 NOTE — Progress Notes (Signed)
   Covid-19 Vaccination Clinic  Name:  Sylvia Arellano    MRN: [id number] DOB: 18-Dec-1970  02/23/2020  Ms. Sylvia Arellano was observed post Covid-19 immunization for 15 minutes without incident. She was provided with Vaccine Information Sheet and instruction to access the V-Safe system.   Ms. Sylvia Arellano was instructed to call 911 with any severe reactions post vaccine: Marland Kitchen Difficulty breathing  . Swelling of face and throat  . A fast heartbeat  . A bad rash all over body  . Dizziness and weakness   Immunizations Administered    Name Date Dose VIS Date Route   Moderna COVID-19 Vaccine 02/23/2020  2:20 PM 0.5 mL 04/2019 Intramuscular   Manufacturer: Moderna   Lot: [id number]   NDC: 63335-456-25

## 2020-02-23 NOTE — Progress Notes (Signed)
Patient tolerated injection well today. Patient received water and was observed with no concerns.

## 2020-03-06 ENCOUNTER — Telehealth (HOSPITAL_COMMUNITY): Payer: No Payment, Other | Admitting: Adult Health

## 2020-04-26 ENCOUNTER — Ambulatory Visit (HOSPITAL_COMMUNITY): Payer: No Payment, Other | Admitting: Physician Assistant

## 2020-05-23 ENCOUNTER — Emergency Department (HOSPITAL_COMMUNITY)
Admission: EM | Admit: 2020-05-23 | Discharge: 2020-05-24 | Discharge status: Against Medical Advice | Payer: Self-pay | Attending: Emergency Medicine | Admitting: Emergency Medicine

## 2020-05-23 ENCOUNTER — Other Ambulatory Visit: Payer: Self-pay

## 2020-05-23 DIAGNOSIS — M791 Myalgia, unspecified site: Secondary | ICD-10-CM | POA: Insufficient documentation

## 2020-05-23 DIAGNOSIS — Z8669 Personal history of other diseases of the nervous system and sense organs: Secondary | ICD-10-CM | POA: Insufficient documentation

## 2020-05-23 DIAGNOSIS — Z87898 Personal history of other specified conditions: Secondary | ICD-10-CM

## 2020-05-23 DIAGNOSIS — F17290 Nicotine dependence, other tobacco product, uncomplicated: Secondary | ICD-10-CM | POA: Insufficient documentation

## 2020-05-23 DIAGNOSIS — R55 Syncope and collapse: Secondary | ICD-10-CM | POA: Insufficient documentation

## 2020-05-23 LAB — CBC
HCT: 42.7 % (ref 36.0–46.0)
Hemoglobin: 13.8 g/dL (ref 12.0–15.0)
MCH: 28.3 pg (ref 26.0–34.0)
MCHC: 32.3 g/dL (ref 30.0–36.0)
MCV: 87.5 fL (ref 80.0–100.0)
Platelets: 187 10*3/uL (ref 150–400)
RBC: 4.88 MIL/uL (ref 3.87–5.11)
RDW: 13.5 % (ref 11.5–15.5)
WBC: 6.4 10*3/uL (ref 4.0–10.5)
nRBC: 0 % (ref 0.0–0.2)

## 2020-05-23 LAB — BASIC METABOLIC PANEL
Anion gap: 8 (ref 5–15)
BUN: 17 mg/dL (ref 6–20)
CO2: 25 mmol/L (ref 22–32)
Calcium: 9.4 mg/dL (ref 8.9–10.3)
Chloride: 104 mmol/L (ref 98–111)
Creatinine, Ser: 1.04 mg/dL — ABNORMAL HIGH (ref 0.44–1.00)
GFR, Estimated: >60 mL/min (ref >60)
Glucose, Bld: 85 mg/dL (ref 70–99)
Potassium: 3.9 mmol/L (ref 3.5–5.1)
Sodium: 137 mmol/L (ref 135–145)

## 2020-05-23 NOTE — ED Triage Notes (Signed)
Pt wants to get checked to see if she had a seizure yesterday. Reports waking up this morning and her body felt like she had a seizure and had also bitten her tongue. Hx of seizure, last seizure eight months ago, and is not on medication for such. Also would like refills of her psych medication, as she has been out of medication d/t insurance and affordability issues. Pt denies complaints at this time.

## 2020-05-23 NOTE — ED Notes (Signed)
Pt stated she was going to her car to get a drink. Pt was told to come back.

## 2020-05-24 ENCOUNTER — Emergency Department (HOSPITAL_COMMUNITY): Payer: Self-pay

## 2020-05-24 NOTE — ED Notes (Signed)
Patient verbalized understanding of discharge instructions. Opportunity for questions and answers.  

## 2020-05-24 NOTE — ED Provider Notes (Signed)
[facility] EMERGENCY DEPARTMENT Provider Note   CSN: [id number] Arrival date & time: 05/23/20  1550     History Chief Complaint  Patient presents with  . Seizures    Sylvia Arellano is a 50 y.o. female.  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 States she woke up on Monday morning (yesterday) and felt like her body was sore. she believes she may have bitten her tongue. She had not soiled her underwear. Expresses concern that she may have had a seizure. This was not witnessed by anyone. States she felt similarly last 8 months ago. Does endorse cocaine use on Sunday evening. Drinks ETOH, but denies drinking in excess. No tremors when not drinking. 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 Had a reassuring MRI and MRA at that time, but EEG was never performed due to patient leaving AMA. She denies ever continuing follow up with neurology.  Also requesting refills of her psychiatric medications. Is usually followed at [facility], but has had some financial constraints recently. Last took her psychiatric medications ~ 3 months ago.       Past Medical History:  Diagnosis Date  . Bipolar 2 disorder (HCC)   . Seizures (HCC)   . Suicide attempt [facility])     Patient Active Problem List   Diagnosis Date Noted  . Cocaine use   . Encephalopathy, hypertensive   . TIA (transient ischemic attack) 07/09/2014  . Hypertensive urgency 07/09/2014  . Cocaine abuse (HCC) 07/09/2014  . Alcohol abuse, daily use 07/09/2014  . Tobacco abuse 07/09/2014  . History of seizure 07/09/2014  . Bipolar 2 disorder (HCC) 07/09/2014  . Hypokalemia 07/09/2014  . Cerebral infarction due to unspecified mechanism     No past surgical history on file.   OB History   No obstetric history on file.     No family history on file.  Social History   Tobacco Use  . Smoking status: Current Every  Day Smoker    Packs/day: 0.50  Substance Use Topics  . Alcohol use: Yes    Comment: "twice a week"  . Drug use: Yes    Types: Cocaine    Home Medications Prior to Admission medications   Medication Sig Start Date End Date Taking? Authorizing Provider  divalproex (DEPAKOTE ER) 500 MG 24 hr tablet Take 1,000 mg by mouth at bedtime.    [provider]  FLUoxetine (PROZAC) 20 MG capsule Take 20 mg by mouth 2 (two) times daily.    [provider]  gabapentin (NEURONTIN) 400 MG capsule Take 1,200 mg by mouth 3 (three) times daily.    [provider]  HYDROXYZINE PAMOATE PO Take 100 mg by mouth 2 (two) times daily.     [provider]    Allergies    Patient has no known allergies.  Review of Systems   Review of Systems  Ten systems reviewed and are negative for acute change, except as noted in the HPI.    Physical Exam Updated Vital Signs BP 126/82   Pulse 88   Temp 98.6 F (37 C)   Resp 17   Ht 5\' 2"  (1.575 m)   Wt 63.5 kg   LMP  (LMP Unknown)   SpO2 98%   BMI 25.60 kg/m   Physical Exam Vitals and nursing note reviewed.  Constitutional:      General: She  is not in acute distress.    Appearance: She is well-developed and well-nourished. She is not diaphoretic.     Comments: Nontoxic appearing and in NAD  HENT:     Head: Normocephalic and atraumatic.     Right Ear: External ear normal.     Left Ear: External ear normal.     Mouth/Throat:     Mouth: Mucous membranes are moist.     Comments: No evidence of tongue trauma. Oropharynx clear. Eyes:     General: No scleral icterus.    Extraocular Movements: Extraocular movements intact and EOM normal.     Conjunctiva/sclera: Conjunctivae normal.  Pulmonary:     Effort: Pulmonary effort is normal. No respiratory distress.     Comments: Respirations even and unlabored Musculoskeletal:        General: Normal range of motion.     Cervical back: Normal range of motion.  Skin:     General: Skin is warm and dry.     Coloration: Skin is not pale.     Findings: No erythema or rash.  Neurological:     Mental Status: She is alert and oriented to person, place, and time.     Coordination: Coordination normal.     Comments: GCS 15. Speech is goal oriented. No cranial nerve deficits appreciated; symmetric eyebrow raise, no facial drooping, tongue midline. Moving all extremities spontaneously; no gross focal deficits appreciated.   Psychiatric:        Mood and Affect: Mood and affect normal.        Behavior: Behavior normal.     ED Results / Procedures / Treatments   Labs (all labs ordered are listed, but only abnormal results are displayed) Labs Reviewed  BASIC METABOLIC PANEL - Abnormal; Notable for the following components:      Result Value   Creatinine, Ser 1.04 (*)    All other components within normal limits  CBC    EKG None  Radiology CT Head Wo Contrast  Result Date: 05/24/2020 CLINICAL DATA:  Seizure EXAM: CT HEAD WITHOUT CONTRAST TECHNIQUE: Contiguous axial images were obtained from the base of the skull through the vertex without intravenous contrast. COMPARISON:  MRI 07/09/2014 FINDINGS: Brain: Normal anatomic configuration. No abnormal intra or extra-axial mass lesion or fluid collection. No abnormal mass effect or midline shift. No evidence of acute intracranial hemorrhage or infarct. Ventricular size is normal. Cerebellum unremarkable. Vascular: Unremarkable Skull: Intact Sinuses/Orbits: Paranasal sinuses are clear. Orbits are unremarkable. Other: Mastoid air cells and middle ear cavities are clear. IMPRESSION: Normal examination.  No acute intracranial abnormality. Electronically Signed   By: Fidela Salisbury MD   On: 05/24/2020 05:30    Procedures Procedures (including critical care time)  Medications Ordered in ED Medications - No data to display  ED Course  I have reviewed the triage vital signs and the nursing notes.  Pertinent labs & imaging  results that were available during my care of the patient were reviewed by me and considered in my medical decision making (see chart for details).    MDM Rules/Calculators/A&P                          50 year old female presents to the emergency department for evaluation after she awoke yesterday morning feeling as though her muscles were sore and that she had bitten her tongue.  She has no evidence of tongue trauma.  No focal neurologic deficits.  Expresses concern for seizure, but  has not had any witnessed seizure activity since ED arrival 10+ hours ago.  Does endorse cocaine use Sunday evening which may have precipitated some of these symptoms. Advised patient d/c use of illicit substances.  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  MRI and MRA at that time were normal.  She has never followed up with neurology and is not presently on any medications for epilepsy.  Her laboratory evaluation and head CT today were reassuring.  Will refer to outpatient neurology for follow-up.  Patient also requesting refill of her psychiatric prescriptions which she discontinued 3 months ago due to financial constraints.  She has been referred back to [facility] as this is who followed her psychiatric diagnoses in the past.  Return precautions discussed and provided. Patient discharged in stable condition with no unaddressed concerns.  Vitals:   05/24/20 0402 05/24/20 0444 05/24/20 0445 05/24/20 0603  BP: (!) 129/97 (!) 128/96  126/82  Pulse: 74 68  88  Resp: 16 17  17   Temp: (!) 97.5 F (36.4 C)   98.6 F (37 C)  TempSrc: Oral     SpO2: 100% 98%  98%  Weight:   63.5 kg   Height:   5\' 2"  (1.575 m)     Final Clinical Impression(s) / ED Diagnoses Final diagnoses:  Muscle soreness  History of seizures    Rx / DC Orders ED Discharge Orders         Ordered    Ambulatory referral to Neurology       Comments: An appointment  is requested in approximately: 2 weeks   05/24/20 0553           , PA-C 05/24/20 Antony Madura    07/22/20 [id number], MD 05/25/20 [phone]

## 2020-05-24 NOTE — Discharge Instructions (Signed)
Your evaluation in the ED today was reassuring.  We recommend follow-up with neurology for further evaluation of possible seizure activity/seizure disorder.  You have been referred back to [facility] for management.  They should be able to refill and review your psychiatric medications.  Discontinue use of illicit substances including cocaine.  Return for new or concerning symptoms.

## 2020-05-26 ENCOUNTER — Other Ambulatory Visit: Payer: Self-pay

## 2020-05-26 ENCOUNTER — Emergency Department (HOSPITAL_COMMUNITY)
Admission: EM | Admit: 2020-05-26 | Discharge: 2020-05-26 | Disposition: A | Discharge status: Other Acute Inpatient Hospital | Payer: Self-pay | Attending: Emergency Medicine | Admitting: Emergency Medicine

## 2020-05-26 ENCOUNTER — Encounter (HOSPITAL_COMMUNITY): Payer: Self-pay | Admitting: Emergency Medicine

## 2020-05-26 ENCOUNTER — Ambulatory Visit (HOSPITAL_COMMUNITY)
Admission: EM | Admit: 2020-05-26 | Discharge: 2020-05-26 | Disposition: A | Discharge status: Other Acute Inpatient Hospital | Payer: No Payment, Other | Attending: Psychiatry | Admitting: Psychiatry

## 2020-05-26 ENCOUNTER — Inpatient Hospital Stay (HOSPITAL_COMMUNITY)
Admission: RE | Admit: 2020-05-26 | Discharge: 2020-05-30 | DRG: 885 | Disposition: A | Discharge status: Home / Self Care | Payer: Federal, State, Local not specified - Other | Source: Intra-hospital | Attending: Psychiatry | Admitting: Psychiatry

## 2020-05-26 DIAGNOSIS — R259 Unspecified abnormal involuntary movements: Secondary | ICD-10-CM | POA: Insufficient documentation

## 2020-05-26 DIAGNOSIS — F10129 Alcohol abuse with intoxication, unspecified: Secondary | ICD-10-CM | POA: Diagnosis present

## 2020-05-26 DIAGNOSIS — R569 Unspecified convulsions: Secondary | ICD-10-CM | POA: Diagnosis present

## 2020-05-26 DIAGNOSIS — Y906 Blood alcohol level of 120-199 mg/100 ml: Secondary | ICD-10-CM | POA: Diagnosis present

## 2020-05-26 DIAGNOSIS — F1410 Cocaine abuse, uncomplicated: Secondary | ICD-10-CM | POA: Diagnosis present

## 2020-05-26 DIAGNOSIS — F1490 Cocaine use, unspecified, uncomplicated: Secondary | ICD-10-CM | POA: Diagnosis present

## 2020-05-26 DIAGNOSIS — Z20822 Contact with and (suspected) exposure to covid-19: Secondary | ICD-10-CM | POA: Insufficient documentation

## 2020-05-26 DIAGNOSIS — X58XXXA Exposure to other specified factors, initial encounter: Secondary | ICD-10-CM | POA: Insufficient documentation

## 2020-05-26 DIAGNOSIS — Z79899 Other long term (current) drug therapy: Secondary | ICD-10-CM

## 2020-05-26 DIAGNOSIS — U071 COVID-19: Secondary | ICD-10-CM | POA: Diagnosis present

## 2020-05-26 DIAGNOSIS — G3184 Mild cognitive impairment, so stated: Secondary | ICD-10-CM | POA: Diagnosis present

## 2020-05-26 DIAGNOSIS — R451 Restlessness and agitation: Secondary | ICD-10-CM | POA: Diagnosis not present

## 2020-05-26 DIAGNOSIS — R454 Irritability and anger: Secondary | ICD-10-CM | POA: Insufficient documentation

## 2020-05-26 DIAGNOSIS — F1010 Alcohol abuse, uncomplicated: Secondary | ICD-10-CM | POA: Diagnosis present

## 2020-05-26 DIAGNOSIS — R45851 Suicidal ideations: Secondary | ICD-10-CM | POA: Diagnosis present

## 2020-05-26 DIAGNOSIS — R4588 Nonsuicidal self-harm: Secondary | ICD-10-CM | POA: Insufficient documentation

## 2020-05-26 DIAGNOSIS — F319 Bipolar disorder, unspecified: Secondary | ICD-10-CM | POA: Insufficient documentation

## 2020-05-26 DIAGNOSIS — Z23 Encounter for immunization: Secondary | ICD-10-CM | POA: Insufficient documentation

## 2020-05-26 DIAGNOSIS — Z046 Encounter for general psychiatric examination, requested by authority: Secondary | ICD-10-CM

## 2020-05-26 DIAGNOSIS — F1091 Alcohol use, unspecified, in remission: Secondary | ICD-10-CM | POA: Diagnosis present

## 2020-05-26 DIAGNOSIS — F17200 Nicotine dependence, unspecified, uncomplicated: Secondary | ICD-10-CM | POA: Insufficient documentation

## 2020-05-26 DIAGNOSIS — Z9114 Patient's other noncompliance with medication regimen: Secondary | ICD-10-CM | POA: Diagnosis not present

## 2020-05-26 DIAGNOSIS — R443 Hallucinations, unspecified: Secondary | ICD-10-CM | POA: Insufficient documentation

## 2020-05-26 DIAGNOSIS — F17210 Nicotine dependence, cigarettes, uncomplicated: Secondary | ICD-10-CM | POA: Diagnosis not present

## 2020-05-26 DIAGNOSIS — F32A Depression, unspecified: Secondary | ICD-10-CM | POA: Insufficient documentation

## 2020-05-26 DIAGNOSIS — T50905A Adverse effect of unspecified drugs, medicaments and biological substances, initial encounter: Secondary | ICD-10-CM | POA: Insufficient documentation

## 2020-05-26 DIAGNOSIS — I10 Essential (primary) hypertension: Secondary | ICD-10-CM | POA: Insufficient documentation

## 2020-05-26 DIAGNOSIS — S61412A Laceration without foreign body of left hand, initial encounter: Secondary | ICD-10-CM | POA: Insufficient documentation

## 2020-05-26 LAB — RESP PANEL BY RT-PCR (FLU A&B, COVID) ARPGX2
Influenza A by PCR: NEGATIVE
Influenza B by PCR: NEGATIVE
SARS Coronavirus 2 by RT PCR: NEGATIVE

## 2020-05-26 LAB — CBC WITH DIFFERENTIAL/PLATELET
Abs Immature Granulocytes: 0.01 10*3/uL (ref 0.00–0.07)
Basophils Absolute: 0.1 10*3/uL (ref 0.0–0.1)
Basophils Relative: 1 %
Eosinophils Absolute: 0.1 10*3/uL (ref 0.0–0.5)
Eosinophils Relative: 1 %
HCT: 42.8 % (ref 36.0–46.0)
Hemoglobin: 14.1 g/dL (ref 12.0–15.0)
Immature Granulocytes: 0 %
Lymphocytes Relative: 55 %
Lymphs Abs: 3.3 10*3/uL (ref 0.7–4.0)
MCH: 29 pg (ref 26.0–34.0)
MCHC: 32.9 g/dL (ref 30.0–36.0)
MCV: 88.1 fL (ref 80.0–100.0)
Monocytes Absolute: 0.4 10*3/uL (ref 0.1–1.0)
Monocytes Relative: 7 %
Neutro Abs: 2.1 10*3/uL (ref 1.7–7.7)
Neutrophils Relative %: 36 %
Platelets: 206 10*3/uL (ref 150–400)
RBC: 4.86 MIL/uL (ref 3.87–5.11)
RDW: 13.5 % (ref 11.5–15.5)
WBC: 5.9 10*3/uL (ref 4.0–10.5)
nRBC: 0 % (ref 0.0–0.2)

## 2020-05-26 LAB — COMPREHENSIVE METABOLIC PANEL
ALT: 23 U/L (ref 0–44)
AST: 34 U/L (ref 15–41)
Albumin: 4.7 g/dL (ref 3.5–5.0)
Alkaline Phosphatase: 62 U/L (ref 38–126)
Anion gap: 13 (ref 5–15)
BUN: 14 mg/dL (ref 6–20)
CO2: 25 mmol/L (ref 22–32)
Calcium: 9.1 mg/dL (ref 8.9–10.3)
Chloride: 101 mmol/L (ref 98–111)
Creatinine, Ser: 0.79 mg/dL (ref 0.44–1.00)
GFR, Estimated: >60 mL/min (ref >60)
Glucose, Bld: 83 mg/dL (ref 70–99)
Potassium: 3.8 mmol/L (ref 3.5–5.1)
Sodium: 139 mmol/L (ref 135–145)
Total Bilirubin: 0.9 mg/dL (ref 0.3–1.2)
Total Protein: 9 g/dL — ABNORMAL HIGH (ref 6.5–8.1)

## 2020-05-26 LAB — RAPID URINE DRUG SCREEN, HOSP PERFORMED
Amphetamines: NOT DETECTED
Barbiturates: NOT DETECTED
Benzodiazepines: NOT DETECTED
Cocaine: POSITIVE — AB
Opiates: NOT DETECTED
Tetrahydrocannabinol: NOT DETECTED

## 2020-05-26 LAB — ETHANOL: Alcohol, Ethyl (B): 134 mg/dL — ABNORMAL HIGH (ref <10)

## 2020-05-26 LAB — ACETAMINOPHEN LEVEL: Acetaminophen (Tylenol), Serum: <10 ug/mL — ABNORMAL LOW (ref 10–30)

## 2020-05-26 LAB — SALICYLATE LEVEL: Salicylate Lvl: <7 mg/dL — ABNORMAL LOW (ref 7.0–30.0)

## 2020-05-26 LAB — PREGNANCY, URINE: Preg Test, Ur: NEGATIVE

## 2020-05-26 MED ORDER — TETANUS-DIPHTH-ACELL PERTUSSIS 5-2.5-18.5 LF-MCG/0.5 IM SUSY
0.5000 mL | PREFILLED_SYRINGE | Freq: Once | INTRAMUSCULAR | Status: AC
  Administered 2020-05-26: 0.5 mL via INTRAMUSCULAR
  Filled 2020-05-26: qty 0.5

## 2020-05-26 MED ORDER — MAGNESIUM HYDROXIDE 400 MG/5ML PO SUSP
30.0000 mL | Freq: Every day | ORAL | Status: DC | PRN
  Administered 2020-05-28: 30 mL via ORAL
  Filled 2020-05-26: qty 30

## 2020-05-26 MED ORDER — ACETAMINOPHEN 500 MG PO TABS
1000.0000 mg | ORAL_TABLET | Freq: Once | ORAL | Status: AC
  Administered 2020-05-26: 1000 mg via ORAL
  Filled 2020-05-26: qty 2

## 2020-05-26 MED ORDER — ACETAMINOPHEN 325 MG PO TABS: 650.0000 mg | ORAL_TABLET | Freq: Four times a day (QID) | ORAL | Status: DC | PRN

## 2020-05-26 MED ORDER — ALUM & MAG HYDROXIDE-SIMETH 200-200-20 MG/5ML PO SUSP: 30.0000 mL | ORAL | Status: DC | PRN

## 2020-05-26 MED ORDER — TRAZODONE HCL 100 MG PO TABS
50.0000 mg | ORAL_TABLET | Freq: Every evening | ORAL | Status: DC | PRN
  Administered 2020-05-26 – 2020-05-28 (×3): 50 mg via ORAL
  Filled 2020-05-26 (×2): qty 1

## 2020-05-26 MED ORDER — HYDROXYZINE HCL 25 MG PO TABS
25.0000 mg | ORAL_TABLET | Freq: Three times a day (TID) | ORAL | Status: DC | PRN
  Administered 2020-05-26 – 2020-05-28 (×3): 25 mg via ORAL
  Filled 2020-05-26 (×4): qty 1

## 2020-05-26 NOTE — ED Notes (Signed)
One bag of patient belongings placed behind the nurses station in front of hall B.

## 2020-05-26 NOTE — ED Notes (Signed)
Patient discharged.

## 2020-05-26 NOTE — BH Assessment (Signed)
Per Earlene Plater, MD, patient meets criteria  for inpatient treatment and was IVC'd by her daughter.
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
## 2020-05-26 NOTE — ED Triage Notes (Signed)
Per IVC paper work, patient is using substances-was seen at [facility] and Versie Starks took out IVC paper work which brought here to ED

## 2020-05-26 NOTE — Progress Notes (Signed)
Pt accepted to  [facility], [address], [place], Bed [address]  Earlene Plater, MD is the accepting provider.    Dr. Jola Babinski is the attending provider.    Call report to  [phone]     [facility] ED notified.     Pt is IVC.    Pt may be transported by [facility]   ED Nurse is to call [facility], [phone] to determine the time when patient can arrive tonight.  Ladoris Gene MSW,LCSWA,LCASA Clinical Social Worker  Long Disposition [phone] (cell)

## 2020-05-26 NOTE — ED Triage Notes (Addendum)
Patient brought into the [facility] by EMS. Patient denies SI, during triage but positive for auditory hallucinations and visual hallucinations. Patient states she see and communicates with [employer]. Patient also states she was supposed to get medication from [facility]. Patient very tearful unable to answer all questions, seem to be responding to internal stimuli during triage, states " I hate me. I just hate me." According to EMS, paramedics were called by [facility] due to patient endorsing SI. Patient was found in car with daughter at a [employer] on [address], and has been off of medications for a while.

## 2020-05-26 NOTE — ED Notes (Signed)
Patient's daughter would like to be updated:  Vida Rigger  [phone]

## 2020-05-26 NOTE — ED Notes (Signed)
Transportation arranged

## 2020-05-26 NOTE — BH Assessment (Signed)
Comprehensive Clinical Assessment (CCA) Note  05/26/2020 Sylvia Arellano [id number]  Sylvia Arellano is a 50 year old female presenting to [facility] with EMS with chief complaint of SI. Patient initially is guarded and irritable and state "I don't want to repeat the same thing I told the nurse just ask her". Patient eventually express that she was at [facility] with her daughter trying to get medications that she has been without for a few months due to financial issues. Patient reports EMS accompanied her here today from [facility] for concerns about "self-harm". Patient reports having suicidal ideations a few days ago and reports SIB. Patient denies planning to commit suicide but is unable to contract for safety today. Patient reports AVH, reporting hearing voices and seeing spirits. Patient initially acknowledges the voices tell her to harm herself but retracts statement and say they don't. Patient denies HI. Patient reports three prior suicide attempts by overdosing and cutting her wrist. Patient reports history of outpatient/inpatient treatment. Patient reports history of substance use but is guarded and irritable when clinician asks further questions about substance use. Patient consents for TTS to gather collateral information from her daughter Sylvia Arellano [phone].   Patient daughter presented to [facility] stating she IVC'd patient. Daughter also calls and provide information about presenting issues today stating that patient texted her this morning asking for a ride home. Daughter reports that patient was crying hysterical stating that she hates herself, her life and wanted to die. Daughter reports that patient was cutting herself and that patient did not want to go home or anywhere else. Daughter reports that patient said she needed to pick her medication up from [facility] and daughter expressed concerns to Sylvia Arellano who called 911. Daughter reports that patient had two knives and a razor on her. Daughter reports that patient  has been dealing with substance abuse and mental health issues for about 20 years. Daughter reports that patient disappear for days using drugs and is at risk for getting harmed by other due to substance use and owing people money.   Disposition: Per Sylvia Plater, MD, patient is recommended for inpatient treatment and has been IVC'd by her daughter. Patient escorted to ED by [employer].        Chief Complaint:  Chief Complaint  Patient presents with  . Depression   Visit Diagnosis: Bipolar I disorder (HCC)     CCA Screening, Triage and Referral (STR)  Patient Reported Information How did you hear about [place]? Family/Friend (Phreesia 05/26/2020)  Referral name: Sylvia Arellano [facility] 05/26/2020)  Referral phone number: No data recorded  Whom do you see for routine medical problems? I don't have a doctor (Phreesia 05/26/2020)  Practice/Facility Name: No data recorded Practice/Facility Phone Number: No data recorded Name of Contact: No data recorded Contact Number: No data recorded Contact Fax Number: No data recorded Prescriber Name: No data recorded Prescriber Address (if known): No data recorded  What Is the Reason for Your Visit/Call Today? FamIy (Phreesia 05/26/2020)  How Long Has This Been Causing You Problems? > than 6 months (Phreesia 05/26/2020)  What Do You Feel Would Help You the Most Today? Medication (Phreesia 05/26/2020)   Have You Recently Been in Any Inpatient Treatment (Hospital/Detox/Crisis Center/28-Day Program)? Yes (Phreesia 05/26/2020)  Name/Location of Program/Hospital:[facility] (Phreesia 05/26/2020)  How Long Were You There? 2 Day (Phreesia 05/26/2020)  When Were You Discharged? No data recorded  Have You Ever Received Services From [facility] Before? Yes (Phreesia 05/26/2020)  Who Do You See at [facility]? Na (Phreesia 05/26/2020)  Have You Recently Had Any Thoughts About Hurting Yourself? No (Phreesia 05/26/2020)  Are You Planning to Commit  Suicide/Harm Yourself At This time? No (Phreesia 05/26/2020)   Have you Recently Had Thoughts About [place]? No (Phreesia 05/26/2020)  Explanation: No data recorded  Have You Used Any Alcohol or Drugs in the Past 24 Hours? Yes (Phreesia 05/26/2020)  How Long Ago Did You Use Drugs or Alcohol? No data recorded What Did You Use and How Much? alcohol (Phreesia 05/26/2020)   Do You Currently Have a Therapist/Psychiatrist? Yes (Phreesia 05/26/2020)  Name of Therapist/Psychiatrist: Na ([place] 05/26/2020)   Have You Been Recently Discharged From Any Office Practice or Programs? No (Phreesia 05/26/2020)  Explanation of Discharge From Practice/Program: No data recorded    CCA Screening Triage Referral Assessment Type of Contact: Face-to-Face  Is this Initial or Reassessment? No data recorded Date Telepsych consult ordered in CHL:  No data recorded Time Telepsych consult ordered in CHL:  No data recorded  Patient Reported Information Reviewed? Yes  Patient Left Without Being Seen? No data recorded Reason for Not Completing Assessment: No data recorded  Collateral Involvement: Sylvia Arellano [phone]   Does Patient Have a [place]? No data recorded Name and Contact of Legal Guardian: No data recorded If Minor and Not Living with Parent(s), Who has Custody? No data recorded Is CPS involved or ever been involved? No data recorded Is APS involved or ever been involved? No data recorded  Patient Determined To Be At Risk for Harm To Self or Others Based on Review of Patient Reported Information or Presenting Complaint? Yes, for Self-Harm  Method: No data recorded Availability of Means: No data recorded Intent: No data recorded Notification Required: No data recorded Additional Information for Danger to Others Potential: No data recorded Additional Comments for Danger to Others Potential: No data recorded Are There Guns or Other Weapons in Your  Home? No data recorded Types of Guns/Weapons: No data recorded Are These Weapons Safely Secured?                            No data recorded Who Could Verify You Are Able To Have These Secured: No data recorded Do You Have any Outstanding Charges, Pending Court Dates, Parole/Probation? No data recorded Contacted To Inform of Risk of Harm To Self or Others: No data recorded  Location of Assessment: [facility] Services   Does Patient Present under Involuntary Commitment? No  IVC Papers Initial File Date: No data recorded  [place] of Residence: [place]   Patient Currently Receiving the Following Services: No data recorded  Determination of Need: No data recorded  Options For Referral: Inpatient Hospitalization     CCA Biopsychosocial Intake/Chief Complaint:  SI  Current Symptoms/Problems: SI, depression, AVH, substance use   Patient Reported Schizophrenia/Schizoaffective Diagnosis in Past: No data recorded  Strengths: UTA  Preferences: UTA  Abilities: UTA   Type of Services Patient Feels are Needed: No data recorded  Initial Clinical Notes/Concerns: No data recorded  Mental Health Symptoms Depression:  Tearfulness; Sleep (too much or little); Difficulty Concentrating; Irritability; Hopelessness; Increase/decrease in appetite; Change in energy/activity   Duration of Depressive symptoms: Greater than two weeks   Mania:  Irritability; Recklessness   Anxiety:   Worrying; Tension   Psychosis:  Hallucinations   Duration of Psychotic symptoms: No data recorded  Trauma:  N/A   Obsessions:  N/A   Compulsions:  N/A   Inattention:  N/A   Hyperactivity/Impulsivity:  N/A   Oppositional/Defiant Behaviors:  N/A   Emotional Irregularity:  N/A   Other Mood/Personality Symptoms:  No data recorded   Mental Status Exam Appearance and self-care  Stature:  Average   Weight:  Average weight   Clothing:  Neat/clean   Grooming:  Normal   Cosmetic use:   Age appropriate   Posture/gait:  Normal   Motor activity:  Not Remarkable   Sensorium  Attention:  Normal   Concentration:  Normal   Orientation:  Person; Place   Recall/memory:  Normal   Affect and Mood  Affect:  Tearful; Negative   Mood:  Irritable   Relating  Eye contact:  Normal   Facial expression:  Responsive   Attitude toward examiner:  Defensive; Guarded; Irritable   Thought and Language  Speech flow: Pressured; Slurred   Thought content:  Appropriate to Mood and Circumstances   Preoccupation:  None   Hallucinations:  Auditory; Visual   Organization:  No data recorded  [employer] of Knowledge:  Impoverished by (Comment)   Intelligence:  Average   Abstraction:  No data recorded  Judgement:  Impaired   Reality Testing:  No data recorded  Insight:  Fair   Decision Making:  Impulsive   Social Functioning  Social Maturity:  Isolates   Social Judgement:  Heedless   Stress  Stressors:  Surveyor, quantity; Work   Coping Ability:  Overwhelmed; Exhausted   Skill Deficits:  No data recorded  Supports:  Family     Religion:    Leisure/Recreation:    Exercise/Diet: Exercise/Diet Do You Have Any Trouble Sleeping?: Yes   CCA Employment/Education Employment/Work Situation: Employment / Work Situation Employment situation: Unemployed What is the longest time patient has a held a job?: Recently lost her job due to medical issues and not having appropriate documentation to give to employer 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  Education:     CCA Family/Childhood History Family and Relationship History: Family history What is your sexual orientation?: UTA Has your sexual activity been affected by drugs, alcohol, medication, or emotional stress?: UTA Does patient have children?: Yes  Childhood History:  Childhood History Additional childhood history information: UTA Description of patient's relationship  with caregiver when they were a child: UTA Patient's description of current relationship with people who raised him/her: UTA How were you disciplined when you got in trouble as a child/adolescent?: UTA Did patient suffer any verbal/emotional/physical/sexual abuse as a child?: Yes Has patient ever been sexually abused/assaulted/raped as an adolescent or adult?: No  Child/Adolescent Assessment:     CCA Substance Use Alcohol/Drug Use: Alcohol / Drug Use Pain Medications: See MAR Prescriptions: See MAR Over the Counter: See MAR History of alcohol / drug use?: Yes                         ASAM's:  Six Dimensions of Multidimensional Assessment  Dimension 1:  Acute Intoxication and/or Withdrawal Potential:      Dimension 2:  Biomedical Conditions and Complications:      Dimension 3:  Emotional, Behavioral, or Cognitive Conditions and Complications:     Dimension 4:  Readiness to Change:     Dimension 5:  Relapse, Continued use, or Continued Problem Potential:     Dimension 6:  Recovery/Living Environment:     ASAM Severity Score:    ASAM Recommended Level of Treatment:     Substance use Disorder (  SUD)    Recommendations for Services/Supports/Treatments:    DSM5 Diagnoses: Patient Active Problem List   Diagnosis Date Noted  . Bipolar I disorder (HCC)   . Cocaine use   . Encephalopathy, hypertensive   . TIA (transient ischemic attack) 07/09/2014  . Hypertensive urgency 07/09/2014  . Cocaine abuse (HCC) 07/09/2014  . Alcohol abuse, daily use 07/09/2014  . Tobacco abuse 07/09/2014  . History of seizure 07/09/2014  . Bipolar 2 disorder (HCC) 07/09/2014  . Hypokalemia 07/09/2014  . Cerebral infarction due to unspecified mechanism    Disposition: Per Sylvia Plater, MD, patient is recommended for inpatient treatment and has been IVC'd by her daughter. Patient escorted to ED by [employer].  Rylee Huestis Shirlee More, [facility]

## 2020-05-26 NOTE — ED Notes (Signed)
Pt in restroom getting dressed out into purple scrubs.

## 2020-05-26 NOTE — ED Notes (Signed)
Pt walked to restroom without assistance. Pt was asked to provide a urine incase of order to be placed. Pt states she "does not want to do that, she doesn't want an IV or to pee in a cup." Pt also states she "is only here for mental purposes and that she was not doing that today."

## 2020-05-26 NOTE — ED Provider Notes (Signed)
Labs are reassuring.  Pt is medically cleared.   Gwyneth Sprout, MD 05/26/20 1710

## 2020-05-26 NOTE — ED Provider Notes (Addendum)
Behavioral Health Urgent Care Medical Screening Exam  Patient Name: Sylvia Arellano MRN: [id number] Date of Evaluation: 05/26/20 Chief Complaint:   Diagnosis:  Final diagnoses:  Bipolar I disorder (HCC)    History of Present illness: MAKELLE Arellano is a 50 y.o. female with a history of bipolar disorder, cocaine use who has been off of medications who presented to the [facility] by EMS after she endorsed SI. In triage patient denied SI but reports AH. On my assessment she is irritable, agitated, guarded. She declines to answer most questions. She is tangential, disorganized, labile  with pressured speech. Per documentation, patient appeared to be RIS in triage.   Patient is found wandering down the hallways of the [facility] smiling, she is amenable to interview and is able to be redirected to her room. She is guarded and irritable initially, telling me to "refer to his assessment" (referring to TTS) to most questions asked. Patient is minimally cooperative and states "it doesn't matter" to most questions asked. She denies SI when asked directly but states that she is not sure she can remain safe if discharged while smiling. She is unable to contract for safety at this time and becomes tearful while discussing suicidal thoughts and low mood. She reports experiencing SI a couple days ago as well as engaging in self harm although would not provide further details. She reports numerous hospitalizations as well as 3 previous SA. She is guarded about the circumstances leading about the presentation, repeatedly stating "it doesn't matter". When asked if the police brought her to the [facility] she states "you could say that" and declined to answer any other questions. She reports not being on her medications for months due to not being able to afford them and becomes tearful. Discussed with patient the option to being voluntary to get restarted on her medications but she declines and states that she  does not want to stay.    Patient to be IVC'd as she is unable to contract for safety at this time and does not want to remain voluntary.   Patient's daughter, Su Ley [phone]), arrived in the lobby shortly after assessment. She states that she is concerned about her mother's safety and feels that she needs inpatient admission. She states that after the police were called, she filed an IVC. She states that her mother has not been acting herself and believes that she has been using substances. She states that her mother had multiple cuts on her hands and that it appeared as though she was trying to stab herself. She would like to be notified with updates.   Psychiatric Specialty Exam  Presentation  General Appearance:Appropriate for Environment; Fairly Groomed  Eye Contact:Fair  Speech:Pressured  Speech Volume:Increased  Handedness:No data recorded  Mood and Affect  Mood:Irritable  Affect:Congruent; Labile   Thought Process  Thought Processes:Linear; Disorganized (mostly linear, disorganized at times)  Descriptions of Associations:Tangential  Orientation:Full (Time, Place and Person)  Thought Content:Tangential  Hallucinations:Auditory  Ideas of Reference:None  Suicidal Thoughts:No (Patient unable to contract for safety)  Homicidal Thoughts:No   Sensorium  Memory:Immediate Poor; Recent Poor; Remote Poor; Other (comment) (UTA fully as patient is not cooperative)  Judgment:Poor  Insight:Poor   Executive Functions  Concentration:Fair  Attention Span:Fair  Recall:Fair  Fund of Knowledge:Fair  Language:Fair   Psychomotor Activity  Psychomotor Activity:Increased   Assets  Assets:Resilience; Desire for Improvement   Sleep  Sleep:Poor  Number of hours: No data recorded  Physical Exam: Physical Exam Constitutional:  Appearance: Normal appearance. She is normal weight.  HENT:     Head: Normocephalic and atraumatic.  Eyes:     Extraocular Movements:  Extraocular movements intact.  Neurological:     Mental Status: She is alert.    Review of Systems  Reason unable to perform ROS: Patient declined to answer questions.   There were no vitals taken for this visit. There is no height or weight on file to calculate BMI.  Musculoskeletal: Strength & Muscle Tone: within normal limits Gait & Station: normal Patient leans: N/A   [facility] MSE Discharge Disposition for Follow up and Recommendations: Based on my evaluation I certify that psychiatric inpatient services furnished can reasonably be expected to improve the patient's condition which I recommend transfer to an appropriate accepting facility.    Patient is currently voluntary but cannot contract for safety, she will be IVC'd and transported to the ED.    Estella Husk, MD 05/26/2020, 12:34 PM

## 2020-05-26 NOTE — Discharge Instructions (Signed)
Transfer to [facility]  

## 2020-05-27 ENCOUNTER — Encounter (HOSPITAL_COMMUNITY): Payer: Self-pay | Admitting: Psychiatry

## 2020-05-27 DIAGNOSIS — F319 Bipolar disorder, unspecified: Principal | ICD-10-CM

## 2020-05-27 LAB — LIPID PANEL
Cholesterol: 155 mg/dL (ref 0–200)
HDL: 68 mg/dL (ref >40)
LDL Cholesterol: 72 mg/dL (ref 0–99)
Total CHOL/HDL Ratio: 2.3 RATIO
Triglycerides: 77 mg/dL (ref <150)
VLDL: 15 mg/dL (ref 0–40)

## 2020-05-27 LAB — HEMOGLOBIN A1C
Hgb A1c MFr Bld: 5.1 % (ref 4.8–5.6)
Mean Plasma Glucose: 99.67 mg/dL

## 2020-05-27 LAB — TSH: TSH: 2.505 u[IU]/mL (ref 0.350–4.500)

## 2020-05-27 MED ORDER — DIVALPROEX SODIUM ER 500 MG PO TB24
1000.0000 mg | ORAL_TABLET | Freq: Every day | ORAL | Status: DC
  Administered 2020-05-27 – 2020-05-29 (×3): 1000 mg via ORAL
  Filled 2020-05-27 (×4): qty 2

## 2020-05-27 MED ORDER — QUETIAPINE FUMARATE 100 MG PO TABS
100.0000 mg | ORAL_TABLET | Freq: Every day | ORAL | Status: DC
Start: 2020-05-27 — End: 2020-05-28
  Administered 2020-05-27: 100 mg via ORAL
  Filled 2020-05-27 (×3): qty 1

## 2020-05-27 NOTE — Progress Notes (Signed)
Pt received her 1st Moderna COVID vaccination on 03/10/2020 , but has not received her second dose.

## 2020-05-27 NOTE — Progress Notes (Signed)
Recreation Therapy Notes  1.7.22 1308:  LRT went to pt room to complete recreation therapy assessment.  Pt refused at this time.  LRT will attempt to complete assessment at a later time.    Caroll Rancher, LRT/CTRS    Caroll Rancher A 05/27/2020 1:52 PM

## 2020-05-27 NOTE — [facility] Counselor (Signed)
Adult Comprehensive Assessment  Patient ID: Sylvia Arellano, female   DOB: August 07, 1970, 50 y.o.   MRN: [id number]  Information Source: Information source: Patient  Current Stressors:  Patient states their primary concerns and needs for treatment are:: "I had an episode and I haven't been taking my medications" Patient states their goals for this hospitilization and ongoing recovery are:: "to get rest" Educational / Learning stressors: none reported Employment / Job issues: pt reports that she was recently fired from her job Family Relationships: none reported Surveyor, quantity / Lack of resources (include bankruptcy): pt reports she was recently fired from her job Physical health (include injuries & life threatening diseases): none reported Social relationships: none reported Substance abuse: none reported Bereavement / Loss: none reported  Living/Environment/Situation:  Living Arrangements: Non-relatives/Friends Who else lives in the home?: 1 Friend How long has patient lived in current situation?: 5-6 years What is atmosphere in current home: Comfortable  Family History:  Marital status: Single Are you sexually active?: Yes What is your sexual orientation?: heterosexual Does patient have children?: Yes How many children?: 2 How is patient's relationship with their children?: "good"  Childhood History:  By whom was/is the patient raised?: Mother Additional childhood history information: pt reports that her father was rarely around Description of patient's relationship with caregiver when they were a child: "good" Patient's description of current relationship with people who raised him/her: pt's mother is deceased How were you disciplined when you got in trouble as a child/adolescent?: "in the corner" Does patient have siblings?: Yes Number of Siblings: 2 Description of patient's current relationship with siblings: "good" Did patient suffer any verbal/emotional/physical/sexual abuse as  a child?: No Did patient suffer from severe childhood neglect?: No Has patient ever been sexually abused/assaulted/raped as an adolescent or adult?: No Was the patient ever a victim of a crime or a disaster?: No Witnessed domestic violence?: No Has patient been affected by domestic violence as an adult?: No  Education:  Highest grade of school patient has completed: 1 year of college Currently a student?: No Learning disability?: No  Employment/Work Situation:   Employment situation: Unemployed Patient's job has been impacted by current illness: Yes Describe how patient's job has been impacted: Recently lost her job due to medical issues and not having appropriate documentation to give to employer What is the longest time patient has a held a job?: "A couple of years" 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" Has patient ever been in the [employer]?: No  Financial Resources:   Financial resources: No income Does patient have a Lawyer or guardian?: No  Alcohol/Substance Abuse:   What has been your use of drugs/alcohol within the last 12 months?: none reported If attempted suicide, did drugs/alcohol play a role in this?: No Alcohol/Substance Abuse Treatment Hx: Denies past history Has alcohol/substance abuse ever caused legal problems?: No  Social Support System:   Conservation officer, nature Support System: Good Describe Community Support System: "family" Type of faith/religion: "church" How does patient's faith help to cope with current illness?: "pray"  Leisure/Recreation:   Do You Have Hobbies?: Yes Leisure and Hobbies: "watch t.v."  Strengths/Needs:   What is the patient's perception of their strengths?: "I am socialable"  Discharge Plan:   Currently receiving community mental health services: No Patient states concerns and preferences for aftercare planning are: Pt states she is not interested in aftercare resources Does patient have access to  transportation?: Yes Does patient have financial barriers related to discharge medications?: Yes Patient  description of barriers related to discharge medications: pt currently has no income Will patient be returning to same living situation after discharge?: Yes  Summary/Recommendations:   Summary and Recommendations (to be completed by the evaluator): Sylvia Arellano is a 50 year old female presenting to [facility] with EMS with chief complaint of SI. Patient initially is guarded and irritable and state "I don't want to repeat the same thing I told the nurse just ask her". Patient eventually express that she was at [facility] with her daughter trying to get medications that she has been without for a few months due to financial issues. Patient reports EMS accompanied her here today from [facility] for concerns about "self-harm". Patient reports having suicidal ideations a few days ago and reports SIB. Patient denies planning to commit suicide but is unable to contract for safety today. Patient reports [facility], reporting hearing voices and seeing spirits. Patient initially acknowledges the voices tell her to harm herself but retracts statement and say they don't. Patient denies HI. Patient reports three prior suicide attempts by overdosing and cutting her wrist. Patient reports history of outpatient/inpatient treatment. Patient reports history of substance use but is guarded and irritable when clinician asks further questions about substance use. Patient consents for TTS to gather collateral information from her daughter Sylvia Arellano [phone]. Patient daughter presented to [facility] stating she IVC'd patient. Daughter also calls and provide information about presenting issues today stating that patient texted her this morning asking for a ride home. Daughter reports that patient was crying hysterical stating that she hates herself, her life and wanted to die. Daughter reports that patient was cutting herself and that patient did not  want to go home or anywhere else. Daughter reports that patient said she needed to pick her medication up from [facility] and daughter expressed concerns to [place] who called 911. Daughter reports that patient had two knives and a razor on her. Daughter reports that patient has been dealing with substance abuse and mental health issues for about 20 years. Daughter reports that patient disappear for days using drugs and is at risk for getting harmed by other due to substance use and owing people money. Pt was very short and guarded throughout the assessment and only answered with 1-2 words and was very hard to understand. While here, Sylvia Arellano can benefit from crisis stabilization, medication management, therapeutic milieu, and referrals for services.  Sylvia Arellano. 05/27/2020

## 2020-05-27 NOTE — Tx Team (Signed)
Interdisciplinary Treatment and Diagnostic Plan Update  05/27/2020 Time of Session: 9:40am Sylvia Arellano MRN: [id number]  Principal Diagnosis: <principal problem not specified>  Secondary Diagnoses: Active Problems:   Bipolar 1 disorder (HCC)   Current Medications:  Current Facility-Administered Medications  Medication Dose Route Frequency Provider Last Rate Last Admin  . acetaminophen (TYLENOL) tablet 650 mg  650 mg Oral Q6H PRN [name], PA-C      . alum & mag hydroxide-simeth (MAALOX/MYLANTA) 200-200-20 MG/5ML suspension 30 mL  30 mL Oral Q4H PRN [name], PA-C      . hydrOXYzine (ATARAX/VISTARIL) tablet 25 mg  25 mg Oral TID PRN [name], PA-C   25 mg at 05/26/20 2340  . magnesium hydroxide (MILK OF MAGNESIA) suspension 30 mL  30 mL Oral Daily PRN [name], PA-C      . traZODone (DESYREL) tablet 50 mg  50 mg Oral QHS PRN [name], PA-C   50 mg at 05/26/20 2340   PTA Medications: Medications Prior to Admission  Medication Sig Dispense Refill Last Dose  . FLUoxetine (PROZAC) 20 MG tablet Take 20 mg by mouth 2 (two) times daily. PT takes 20 mg BID     . gabapentin (NEURONTIN) 400 MG capsule Take 1,200 mg by mouth 3 (three) times daily. Pt takes 1200 mg TID     . hydrOXYzine (VISTARIL) 100 MG capsule Take 100 mg by mouth 2 (two) times daily.     Marland Kitchen ibuprofen (ADVIL) 200 MG tablet Take 400 mg by mouth every 6 (six) hours as needed for fever, headache or mild pain.     . divalproex (DEPAKOTE ER) 500 MG 24 hr tablet Take 1,000 mg by mouth daily. Pt takes 1000 mg at bedtime       Patient Stressors: Marital or family conflict Medication change or noncompliance Substance abuse  Patient Strengths: General fund of knowledge Motivation for treatment/growth  Treatment Modalities: Medication Management, Group therapy, Case management,  1 to 1 session with clinician, Psychoeducation, Recreational therapy.   Physician Treatment Plan for Primary Diagnosis:  <principal problem not specified> Long Term Goal(s):     Short Term Goals:    Medication Management: Evaluate patient's response, side effects, and tolerance of medication regimen.  Therapeutic Interventions: 1 to 1 sessions, Unit Group sessions and Medication administration.  Evaluation of Outcomes: Not Met  Physician Treatment Plan for Secondary Diagnosis: Active Problems:   Bipolar 1 disorder ([place])  Long Term Goal(s):     Short Term Goals:       Medication Management: Evaluate patient's response, side effects, and tolerance of medication regimen.  Therapeutic Interventions: 1 to 1 sessions, Unit Group sessions and Medication administration.  Evaluation of Outcomes: Not Met   RN Treatment Plan for Primary Diagnosis: <principal problem not specified> Long Term Goal(s): Knowledge of disease and therapeutic regimen to maintain health will improve  Short Term Goals: Ability to demonstrate self-control, Ability to participate in decision making will improve and Ability to verbalize feelings will improve  Medication Management: RN will administer medications as ordered by provider, will assess and evaluate patient's response and provide education to patient for prescribed medication. RN will report any adverse and/or side effects to prescribing provider.  Therapeutic Interventions: 1 on 1 counseling sessions, Psychoeducation, Medication administration, Evaluate responses to treatment, Monitor vital signs and CBGs as ordered, Perform/monitor CIWA, COWS, AIMS and Fall Risk screenings as ordered, Perform wound care treatments as ordered.  Evaluation of Outcomes: Not Met  LCSW Treatment Plan for Primary Diagnosis: <principal problem not specified> Long Term Goal(s): Safe transition to appropriate next level of care at discharge, Engage patient in therapeutic group addressing interpersonal concerns.  Short Term Goals: Engage patient in aftercare planning with referrals and resources,  Increase social support and Increase ability to appropriately verbalize feelings  Therapeutic Interventions: Assess for all discharge needs, 1 to 1 time with Social worker, Explore available resources and support systems, Assess for adequacy in community support network, Educate family and significant other(s) on suicide prevention, Complete Psychosocial Assessment, Interpersonal group therapy.  Evaluation of Outcomes: Not Met   Progress in Treatment: Attending groups: No. Participating in groups: No. Taking medication as prescribed: Yes. Toleration medication: Yes. Family/Significant other contact made: No, will contact:  pt declined consents Patient understands diagnosis: No. Discussing patient identified problems/goals with staff: Yes. Medical problems stabilized or resolved: Yes. Denies suicidal/homicidal ideation: Yes. Issues/concerns per patient self-inventory: Yes. Other:   New problem(s) identified: No, Describe:  CSW will continue to assess  New Short Term/Long Term Goal(s):medication stabilization, elimination of SI thoughts, development of comprehensive mental wellness plan.  Patient Goals:  "to get some rest"  Discharge Plan or Barriers: Patient recently admitted. CSW will continue to follow and assess for appropriate referrals and possible discharge planning.  Reason for Continuation of Hospitalization: Depression Medication stabilization Suicidal ideation  Estimated Length of Stay: 3-5 days  Attendees: Patient: Sylvia Arellano 05/27/2020   Physician: Dr. Claris Gower 05/27/2020   Nursing:  05/27/2020   RN Care Manager: 05/27/2020   Social Worker: [name], [place] 05/27/2020   Recreational Therapist:  05/27/2020   Other:  05/27/2020   Other:  05/27/2020   Other: 05/27/2020      Scribe for Treatment Team: Mliss Fritz, [place] 05/27/2020 1:24 PM

## 2020-05-27 NOTE — H&P (Addendum)
Psychiatric Admission Assessment Adult  Patient Identification: Sylvia Arellano MRN:  [id number] Date of Evaluation:  05/27/2020 Chief Complaint:  Bipolar 1 disorder (HCC) [F31.9] Principal Diagnosis: Bipolar 1 disorder (HCC) Diagnosis:  Principal Problem:   Bipolar 1 disorder (HCC) Active Problems:   Cocaine abuse (HCC)   Alcohol abuse, daily use  History of Present Illness:  Sylvia Arellano is a 50 yr old female who presents under IVC for Manic symptoms and SI. PPHx is significant for Bipolar disorder and previous hospitalizations (last at [facility] was 2005).  She appears intoxicated on exam today. She did not want to answer questions and when she did the answers where slow to come and slurred.    She reports that she has been feeling depressed for a long time now. She states that she has not wanted to life anymore. She reports no previous suicide attempt. She reports that she has been cutting herself. She reports that she stopped taking her medication about 3 months ago (on chart review reported due to cost). She reports that her medications were not effective anyway. When asked how long she felts they were not working she just said they weren't.  When asked about smoking she reports that she now smokes a cigarette every couple days. When asked about EtOH she states she drinks some but its not a problem. When asked if she has done any illicit substances she states she used to but not recently. I then asked if she had done cocaine recently (due to UDS being positive) she stated that she had done that. She reports continuing to have SI, she denies HI. When asked she reports Auditory and Visual Hallucinations but would not elaborate further.  Given her insistence of going back to sleep and her short answers the rest of the information for this H&P will be obtained from chart review and once she clears more will fill in additional information as obtained.    Per [facility] MSE: "History of Present illness:  Sylvia Arellano is a 50 y.o. female with a history of bipolar disorder, cocaine use who has been off of medications who presented to the [facility] by EMS after she endorsed SI. In triage patient denied SI but reports AH. On my assessment she is irritable, agitated, guarded. She declines to answer most questions. She is tangential, disorganized, labile  with pressured speech. Per documentation, patient appeared to be RIS in triage.   Patient is found wandering down the hallways of the [facility] smiling, she is amenable to interview and is able to be redirected to her room. She is guarded and irritable initially, telling me to "refer to his assessment" (referring to TTS) to most questions asked. Patient is minimally cooperative and states "it doesn't matter" to most questions asked. She denies SI when asked directly but states that she is not sure she can remain safe if discharged while smiling. She is unable to contract for safety at this time and becomes tearful while discussing suicidal thoughts and low mood. She reports experiencing SI a couple days ago as well as engaging in self harm although would not provide further details. She reports numerous hospitalizations as well as 3 previous SA. She is guarded about the circumstances leading about the presentation, repeatedly stating "it doesn't matter". When asked if the police brought her to the [facility] she states "you could say that" and declined to answer any other questions. She reports not being on her medications for months due to not being able to afford  them and becomes tearful. Discussed with patient the option to being voluntary to get restarted on her medications but she declines and states that she  does not want to stay.   Patient to be IVC'd as she is unable to contract for safety at this time and does not want to remain voluntary.   Patient's daughter, Sylvia Arellano [phone]), arrived in the lobby shortly after assessment. She states that she is concerned  about her mother's safety and feels that she needs inpatient admission. She states that after the police were called, she filed an IVC. She states that her mother has not been acting herself and believes that she has been using substances. She states that her mother had multiple cuts on her hands and that it appeared as though she was trying to stab herself. She would like to be notified with updates."  Associated Signs/Symptoms: Depression Symptoms:  depressed mood, anhedonia, feelings of worthlessness/guilt, hopelessness, loss of energy/fatigue, Duration of Depression Symptoms: Greater than two weeks  (Hypo) Manic Symptoms:  Distractibility, Hallucinations, Impulsivity, Irritable Mood, Anxiety Symptoms:  None at present Psychotic Symptoms:  Hallucinations: Auditory Visual Duration of Psychotic Symptoms: No data recorded PTSD Symptoms: NA Total Time spent with patient: 30 minutes  Past Psychiatric History: She reports a history of Bipolar Disorder. She reports multiple hospitalizations (last hospitalized at [facility] in 2005).   Is the patient at risk to self? Yes.    Has the patient been a risk to self in the past 6 months? Yes.    Has the patient been a risk to self within the distant past? No.  Is the patient a risk to others? No.  Has the patient been a risk to others in the past 6 months? No.  Has the patient been a risk to others within the distant past? No.   Prior Inpatient Therapy:  Yes last found [facility] in 2005 Prior Outpatient Therapy:  Yes at [facility]  Alcohol Screening: 1. How often do you have a drink containing alcohol?: 2 to 4 times a month 2. How many drinks containing alcohol do you have on a typical day when you are drinking?: 1 or 2 3. How often do you have six or more drinks on one occasion?: Never AUDIT-C Score: 2 4. How often during the last year have you found that you were not able to stop drinking once you had started?: Never 5. How often during the last year  have you failed to do what was normally expected from you because of drinking?: Never 6. How often during the last year have you needed a first drink in the morning to get yourself going after a heavy drinking session?: Never 7. How often during the last year have you had a feeling of guilt of remorse after drinking?: Weekly 8. How often during the last year have you been unable to remember what happened the night before because you had been drinking?: Never 9. Have you or someone else been injured as a result of your drinking?: No 10. Has a relative or friend or a doctor or another health worker been concerned about your drinking or suggested you cut down?: No Alcohol Use Disorder Identification Test Final Score (AUDIT): 5 Substance Abuse History in the last 12 months:  Yes.   Consequences of Substance Abuse: Medical Consequences:  multiple hospitalizations Previous Psychotropic Medications: Yes  Psychological Evaluations: No  Past Medical History:  Past Medical History:  Diagnosis Date  . Bipolar 2 disorder ([place])   .  Seizures (HCC)   . Suicide attempt [facility])    History reviewed. No pertinent surgical history. Family History: History reviewed. No pertinent family history. Family Psychiatric  History: Mother- Bipolar Tobacco Screening: Have you used any form of tobacco in the last 30 days? (Cigarettes, Smokeless Tobacco, Cigars, and/or Pipes): Yes Tobacco use, Select all that apply: 4 or less cigarettes per day Are you interested in Tobacco Cessation Medications?: No, patient refused Counseled patient on smoking cessation including recognizing danger situations, developing coping skills and basic information about quitting provided: Refused/Declined practical counseling Social History:  Social History   Substance and Sexual Activity  Alcohol Use Yes   Comment: "twice a week"     Social History   Substance and Sexual Activity  Drug Use Yes  . Types: Cocaine    Additional Social  History: Marital status: Single Are you sexually active?: Yes What is your sexual orientation?: heterosexual Does patient have children?: Yes How many children?: 2 How is patient's relationship with their children?: "good"                         Allergies:  No Known Allergies Lab Results:  Results for orders placed or performed during the hospital encounter of 05/26/20 (from the past 48 hour(s))  Hemoglobin A1c     Status: None   Collection Time: 05/27/20  6:31 AM  Result Value Ref Range   Hgb A1c MFr Bld 5.1 4.8 - 5.6 %    Comment: (NOTE) Pre diabetes:          5.7%-6.4%  Diabetes:              >6.4%  Glycemic control for   <7.0% adults with diabetes    Mean Plasma Glucose 99.67 mg/dL    Comment: Performed at [facility], [address] [place], [place] [address]  Lipid panel     Status: None   Collection Time: 05/27/20  6:31 AM  Result Value Ref Range   Cholesterol 155 0 - 200 mg/dL   Triglycerides 77 <751 mg/dL   HDL 68 >02 mg/dL   Total CHOL/HDL Ratio 2.3 RATIO   VLDL 15 0 - 40 mg/dL   LDL Cholesterol 72 0 - 99 mg/dL    Comment:        Total Cholesterol/HDL:CHD Risk Coronary Heart Disease Risk Table                     Men   Women  1/2 Average Risk   3.4   3.3  Average Risk       5.0   4.4  2 X Average Risk   9.6   7.1  3 X Average Risk  23.4   11.0        Use the calculated Patient Ratio above and the CHD Risk Table to determine the patient's CHD Risk.        ATP III CLASSIFICATION (LDL):  <100     mg/dL   Optimal  585-277  mg/dL   Near or Above                    Optimal  130-159  mg/dL   Borderline  824-235  mg/dL   High  >361     mg/dL   Very High Performed at [facility], [address] [place], [place] [address]   TSH     Status: None  Collection Time: 05/27/20  6:31 AM  Result Value Ref Range   TSH 2.505 0.350 - 4.500 uIU/mL    Comment: Performed by a 3rd Generation assay with a functional sensitivity of  <=0.01 uIU/mL. Performed at [facility], [address], [place], [place] [address]     Blood Alcohol level:  Lab Results  Component Value Date   ETH 134 (H) 05/26/2020   ETH 11 (H) 07/09/2014    Metabolic Disorder Labs:  Lab Results  Component Value Date   HGBA1C 5.1 05/27/2020   MPG 99.67 05/27/2020   MPG 117 07/10/2014   No results found for: PROLACTIN Lab Results  Component Value Date   CHOL 155 05/27/2020   TRIG 77 05/27/2020   HDL 68 05/27/2020   CHOLHDL 2.3 05/27/2020   VLDL 15 05/27/2020   LDLCALC 72 05/27/2020   LDLCALC 68 07/10/2014    Current Medications: Current Facility-Administered Medications  Medication Dose Route Frequency Provider Last Rate Last Admin  . acetaminophen (TYLENOL) tablet 650 mg  650 mg Oral Q6H PRN Jaclyn Shaggy, PA-C      . alum & mag hydroxide-simeth (MAALOX/MYLANTA) 200-200-20 MG/5ML suspension 30 mL  30 mL Oral Q4H PRN Melbourne Abts W, PA-C      . divalproex (DEPAKOTE ER) 24 hr tablet 1,000 mg  1,000 mg Oral QHS Lauro Franklin, MD      . hydrOXYzine (ATARAX/VISTARIL) tablet 25 mg  25 mg Oral TID PRN Jaclyn Shaggy, PA-C   25 mg at 05/26/20 2340  . magnesium hydroxide (MILK OF MAGNESIA) suspension 30 mL  30 mL Oral Daily PRN Melbourne Abts W, PA-C      . QUEtiapine (SEROQUEL) tablet 100 mg  100 mg Oral QHS Lauro Franklin, MD      . traZODone (DESYREL) tablet 50 mg  50 mg Oral QHS PRN Melbourne Abts W, PA-C   50 mg at 05/26/20 2340   PTA Medications: Medications Prior to Admission  Medication Sig Dispense Refill Last Dose  . FLUoxetine (PROZAC) 20 MG tablet Take 20 mg by mouth 2 (two) times daily. PT takes 20 mg BID     . gabapentin (NEURONTIN) 400 MG capsule Take 1,200 mg by mouth 3 (three) times daily. Pt takes 1200 mg TID     . hydrOXYzine (VISTARIL) 100 MG capsule Take 100 mg by mouth 2 (two) times daily.     Marland Kitchen ibuprofen (ADVIL) 200 MG tablet Take 400 mg by mouth every 6 (six) hours as needed for  fever, headache or mild pain.     . divalproex (DEPAKOTE ER) 500 MG 24 hr tablet Take 1,000 mg by mouth daily. Pt takes 1000 mg at bedtime       Musculoskeletal: Strength & Muscle Tone: within normal limits Gait & Station: normal Patient leans: N/A  Psychiatric Specialty Exam: Physical Exam Vitals and nursing note reviewed.  Constitutional:      Appearance: Normal appearance. She is normal weight.  HENT:     Head: Normocephalic and atraumatic.  Cardiovascular:     Rate and Rhythm: Normal rate.  Pulmonary:     Effort: Pulmonary effort is normal.  Musculoskeletal:        General: Normal range of motion.  Neurological:     General: No focal deficit present.     Mental Status: She is alert.     Review of Systems  Constitutional: Negative for fatigue and fever.  Respiratory: Negative for chest tightness and shortness of breath.  Cardiovascular: Negative for chest pain and palpitations.  Gastrointestinal: Negative for abdominal pain, constipation, diarrhea, nausea and vomiting.  Neurological: Negative for dizziness, weakness, light-headedness and headaches.  Psychiatric/Behavioral: Positive for hallucinations, self-injury (laceration on Left thumb as noted by ED physician), sleep disturbance and suicidal ideas.    Blood pressure 108/82, pulse 93, temperature 97.7 F (36.5 C), temperature source Oral, resp. rate 18, height 5\' 2"  (1.575 m), weight 57.6 kg, SpO2 99 %.Body mass index is 23.23 kg/m.  General Appearance: Casual and Fairly Groomed  Eye Contact:  Fair  Speech:  Garbled and Slow  Volume:  Decreased  Mood:  Depressed  Affect:  Depressed  Thought Process:  Disorganized  Orientation:  Full (Time, Place, and Person)  Thought Content:  Appears logical but given how little she would answer it is hard to determine at this point  Suicidal Thoughts:  Yes.  without intent/plan  Homicidal Thoughts:  No  Memory:  Immediate;   Cannot determine due to sparse answers Recent;    Cannot determine due to sparse answers  Judgement:  Impaired  Insight:  Lacking  Psychomotor Activity:  Normal  Concentration:  Concentration: Fair  Recall:  Cannot determine due to sparse answers  Fund of Knowledge:  Cannot determine due to sparse answers  Language:  Fair  Akathisia:  Negative  Handed:  Right  AIMS (if indicated):     Assets:  Resilience  ADL's:  Intact  Cognition:  Cannot determine due to sparse answers appears impaired  Sleep:       Treatment Plan Summary: Daily contact with patient to assess and evaluate symptoms and progress in treatment Diagnosis: Bipolar Disorder EKG reveals NSR with QTc of 420. Labs are mostly normal. Unable to determine much at this point due to her impairment/unwillingness to answer questions. Will uphold her IVC given her current impairment and continued endorsement of SI. She does say that she feels her medications were not adequately controlling her symptoms. Since she is endorsing AVH will start Seroquel. Given her possible history of seizures will increase Seroquel slowly and restart her Depakote given its seizure protection. Will not restart her Prozac for now. Will draw a Depakote level in 4 days to ensure within therapeutic range.    Bipolar Disorder: -Restart Depakote ER 1000 mg QHS for mood stabilization -Start Seroquel 100 mg QHS for mood stabilization and AVH -Will hold Prozac 20 mg BID for now to ensure she does not become Manic   -Continue PRN's: Tylenol, Maalox, Atarax, Milk of Magnesia, Trazodone   Observation Level/Precautions:  15 minute checks Seizure  Laboratory: CBC: WNL  CMP: WNL except Protein- 9.0 (high)  Lipid Panel: WNL  A1C: 5.4 (WNL)  TSH: 2.505 (WNL)  Ethanol- 134 (high) Salicylate/Acetaminophen: WNL  ZOX:[id number]:Cocaine positive  Psychotherapy:    Medications:    Consultations:    Discharge Concerns:    Estimated LOS: 3-5 days  Other:     Physician Treatment Plan for Primary Diagnosis: Bipolar 1 disorder  (HCC) Long Term Goal(s): Improvement in symptoms so as ready for discharge  Short Term Goals: Ability to identify changes in lifestyle to reduce recurrence of condition will improve, Ability to verbalize feelings will improve, Ability to disclose and discuss suicidal ideas, Ability to demonstrate self-control will improve, Ability to identify and develop effective coping behaviors will improve, Compliance with prescribed medications will improve and Ability to identify triggers associated with substance abuse/mental health issues will improve  Physician Treatment Plan for Secondary Diagnosis: Principal Problem:   Bipolar  1 disorder (HCC) Active Problems:   Cocaine abuse (HCC)   Alcohol abuse, daily use  Long Term Goal(s): Improvement in symptoms so as ready for discharge  Short Term Goals: Ability to identify changes in lifestyle to reduce recurrence of condition will improve, Ability to verbalize feelings will improve, Ability to disclose and discuss suicidal ideas, Ability to demonstrate self-control will improve, Ability to identify and develop effective coping behaviors will improve, Compliance with prescribed medications will improve and Ability to identify triggers associated with substance abuse/mental health issues will improve  I certify that inpatient services furnished can reasonably be expected to improve the patient's condition.    Lauro Franklin, MD 1/7/20223:30 PM

## 2020-05-27 NOTE — Progress Notes (Signed)
Admission Note:  D:49 yr female who presents IVC in no acute distress for the treatment of SI and Depression. Pt denies SI/ HI at this time , but endorsed AVH. Pt appears flat and depressed. Pt was calm and cooperative with admission process. Pt stated she had an episode and felt bad and wanted to die, she has felt this way since 50 yrs old.   Per Assessment:  presenting to [facility] with EMS with chief complaint of SI. Patient initially is guarded and irritable and state "I don't want to repeat the same thing I told the nurse just ask her". Patient eventually express that she was at [facility] with her daughter trying to get medications that she has been without for a few months due to financial issues. Patient reports EMS accompanied her here today from [facility] for concerns about "self-harm". Patient reports having suicidal ideations a few days ago and reports SIB. Patient denies planning to commit suicide but is unable to contract for safety today. Patient reports AVH, reporting hearing voices and seeing spirits. Patient initially acknowledges the voices tell her to harm herself but retracts statement and say they don't. Patient denies HI. Patient reports three prior suicide attempts by overdosing and cutting her wrist. Patient reports history of outpatient/inpatient treatment. Patient reports history of substance use but is guarded and irritable when clinician asks further questions about substance use.   Daughter also calls and provide information about presenting issues today stating that patient texted her this morning asking for a ride home. Daughter reports that patient was crying hysterical stating that she hates herself, her life and wanted to die. Daughter reports that patient was cutting herself and that patient did not want to go home or anywhere else. Daughter reports that patient said she needed to pick her medication up from [facility] and daughter expressed concerns to [place] who called 911. Daughter  reports that patient had two knives and a razor on her. Daughter reports that patient has been dealing with substance abuse and mental health issues for about 20 years. Daughter reports that patient disappear for days using drugs and is at risk for getting harmed by other due to substance use and owing people money.    A:Skin was assessed and found to be clear of any abnormal marks apart from cuts bilateral thumbs, old scar chest. PT searched and no contraband found, POC and unit policies explained and understanding verbalized. Consents obtained. Food and fluids offered, and fluids accepted.   R:Pt had no additional questions or concerns.

## 2020-05-27 NOTE — [facility] Suicide Risk Assessment (Signed)
[facility] INPATIENT:  Family/Significant Other Suicide Prevention Education  Suicide Prevention Education:  Patient Refusal for Family/Significant Other Suicide Prevention Education: The patient Sylvia Arellano has refused to provide written consent for family/significant other to be provided Family/Significant Other Suicide Prevention Education during admission and/or prior to discharge.  Physician notified.  Felizardo Hoffmann 05/27/2020, 2:08 PM

## 2020-05-27 NOTE — Progress Notes (Signed)
Recreation Therapy Notes  Date: 1.7.22 Time: 1000 Location: 500 Hall Dayroom      Group Topic/Focus: Team Building/Communication  Goal Area(s) Addresses:  Patient will use appropriate interactions in play with peers.   Patient will follow directions on first prompt. Patient will work with peers towards desired goal.  Intervention: Beach ball and Music  Activity: Keep It Going Volleyball.  Seated in a circle, patients were to pass the beach ball to each other.  Patients were to keep the ball in rotation without it coming to a stop.  Patients could bounce the ball off the or the wall if necessary to keep it moving.  LRT would count the number of hits were made on the ball.  If the ball came to a complete stop, the count would start over.  Clinical Observations/Feedback: Pt did not attend group session.     Sylvia Arellano, LRT/CTRS         Susanna Benge A 05/27/2020 12:12 PM 

## 2020-05-27 NOTE — Progress Notes (Signed)
Adult Psychoeducational Group Note  Date:  05/27/2020 Time:  8:56 PM  Group Topic/Focus:  Wrap-Up Group:   The focus of this group is to help patients review their daily goal of treatment and discuss progress on daily workbooks.  Participation Level:  Minimal  Participation Quality:  Drowsy  Affect:  Anixous  Cognitive:  Disorganized and confused  Insight: Limited  Engagement in Group:  Limited, poor, and lacking  Modes of Intervention: Disussion   Additional Comments:  Pt stated her goal for today was to focus on her treatment plan. Pt stated she felt she accomplished her goal today. Pt stated she talk with her doctor and social worker, regarding her care today. Pt stated she took all her medication today from her providers. Pt stated been able to contact her daughter and roommate today improved her overall day. Pt stated her relationship with her family and support team has improved since she was admitted here. Pt rated her overall day a 4 out of 10 today. Pt stated all she did was sleep all day. Pt stated she would try to interact more with peers and staff tomorrow. Pt stated she felt better about herself today. Pt stated her appetite was pretty good today and she was brought back all meals today because she is on unit restriction. Pt stated her sleep last night was good. Pt stated the goal for tonight was to get some rest. Pt stated she was in some physical pain. Pt stated her right and left leg was in some pain. Pt rated the pain level a 3. Pt nurse was updated on situation. Pt deny auditory or visual hallucinations. Pt denies thoughts of harming herself or others. Pt stated she would alert staff if anything changes.  Sylvia Arellano 05/27/2020, 8:56 PM

## 2020-05-27 NOTE — Progress Notes (Signed)
Pt continues to endorse passive SI-contracts for safety, pt +ve AVH . Pt visible on the unit this evening, pt irritable at times, but has been cooperative much of the evening.      05/27/20 2200  Psych Admission Type (Psych Patients Only)  Admission Status Involuntary  Psychosocial Assessment  Eye Contact Fair  Facial Expression Sad  Affect Anxious  Speech Slow  Interaction Assertive  Motor Activity Fidgety;Restless  Appearance/Hygiene Disheveled;In scrubs  Behavior Characteristics Cooperative  Mood Anxious  Aggressive Behavior  Effect No apparent injury  Thought Process  Coherency Concrete thinking  Content WDL  Delusions WDL  Perception WDL  Hallucination None reported or observed  Judgment Poor  Confusion WDL  Danger to Self  Current suicidal ideation? Denies  Danger to Others  Danger to Others None reported or observed

## 2020-05-27 NOTE — Tx Team (Signed)
Initial Treatment Plan 05/27/2020 12:51 AM [name] HER:[id number]    PATIENT STRESSORS: Marital or family conflict Medication change or noncompliance Substance abuse   PATIENT STRENGTHS: General fund of knowledge Motivation for treatment/growth   PATIENT IDENTIFIED PROBLEMS: Risk for suicide  depression  "nothing"                 DISCHARGE CRITERIA:  Improved stabilization in mood, thinking, and/or behavior Verbal commitment to aftercare and medication compliance  PRELIMINARY DISCHARGE PLAN: Attend aftercare/continuing care group Attend PHP/IOP Attend 12-step recovery group Outpatient therapy  PATIENT/FAMILY INVOLVEMENT: This treatment plan has been presented to and reviewed with the patient, Sylvia Arellano.  The patient and family have been given the opportunity to ask questions and make suggestions.  Delos Haring, RN 05/27/2020, 12:51 AM

## 2020-05-27 NOTE — [facility] Group Notes (Signed)
[facility] LCSW Group Therapy  05/27/2020 1:39 PM  Type of Therapy:  Group Therapy  Participation Level:  Did Not Attend   Felizardo Hoffmann 05/27/2020, 1:39 PM

## 2020-05-28 MED ORDER — GABAPENTIN 600 MG PO TABS
1200.0000 mg | ORAL_TABLET | Freq: Three times a day (TID) | ORAL | Status: DC
  Administered 2020-05-28 (×2): 1200 mg via ORAL
  Filled 2020-05-28 (×8): qty 2

## 2020-05-28 MED ORDER — QUETIAPINE FUMARATE 100 MG PO TABS
100.0000 mg | ORAL_TABLET | Freq: Once | ORAL | Status: AC
  Administered 2020-05-28: 100 mg via ORAL
  Filled 2020-05-28: qty 1

## 2020-05-28 MED ORDER — QUETIAPINE FUMARATE 50 MG PO TABS: 50.0000 mg | ORAL_TABLET | Freq: Once | ORAL | Status: DC | PRN

## 2020-05-28 MED ORDER — DIVALPROEX SODIUM ER 500 MG PO TB24: 1000.0000 mg | ORAL_TABLET | Freq: Every day | ORAL | Status: DC

## 2020-05-28 MED ORDER — HALOPERIDOL LACTATE 5 MG/ML IJ SOLN
5.0000 mg | Freq: Three times a day (TID) | INTRAMUSCULAR | Status: DC | PRN
  Administered 2020-05-29: 5 mg via INTRAMUSCULAR
  Filled 2020-05-28: qty 1

## 2020-05-28 MED ORDER — LORAZEPAM 2 MG/ML IJ SOLN
2.0000 mg | Freq: Once | INTRAMUSCULAR | Status: AC
  Administered 2020-05-28: 2 mg via INTRAMUSCULAR

## 2020-05-28 MED ORDER — WHITE PETROLATUM EX OINT
TOPICAL_OINTMENT | CUTANEOUS | Status: AC
  Filled 2020-05-28: qty 5

## 2020-05-28 MED ORDER — LORAZEPAM 1 MG PO TABS: 2.0000 mg | ORAL_TABLET | Freq: Three times a day (TID) | ORAL | Status: DC | PRN

## 2020-05-28 MED ORDER — HALOPERIDOL 5 MG PO TABS: 5.0000 mg | ORAL_TABLET | Freq: Three times a day (TID) | ORAL | Status: DC | PRN

## 2020-05-28 MED ORDER — QUETIAPINE FUMARATE 100 MG PO TABS
200.0000 mg | ORAL_TABLET | Freq: Every day | ORAL | Status: DC
  Administered 2020-05-28 – 2020-05-29 (×2): 200 mg via ORAL
  Filled 2020-05-28 (×2): qty 1
  Filled 2020-05-28: qty 2

## 2020-05-28 MED ORDER — HYDROXYZINE HCL 25 MG PO TABS
25.0000 mg | ORAL_TABLET | Freq: Once | ORAL | Status: AC
  Administered 2020-05-28: 25 mg via ORAL

## 2020-05-28 MED ORDER — DIVALPROEX SODIUM 500 MG PO DR TAB
500.0000 mg | DELAYED_RELEASE_TABLET | Freq: Once | ORAL | Status: AC
  Administered 2020-05-28: 500 mg via ORAL
  Filled 2020-05-28 (×2): qty 1

## 2020-05-28 MED ORDER — LORAZEPAM 2 MG/ML IJ SOLN: 2.0000 mg | Freq: Three times a day (TID) | INTRAMUSCULAR | Status: DC | PRN

## 2020-05-28 MED ORDER — LORAZEPAM 2 MG/ML IJ SOLN
INTRAMUSCULAR | Status: AC
  Filled 2020-05-28: qty 1

## 2020-05-28 MED ORDER — FLUOXETINE HCL 20 MG PO TABS
40.0000 mg | ORAL_TABLET | Freq: Every day | ORAL | Status: DC
  Administered 2020-05-28 – 2020-05-29 (×2): 40 mg via ORAL
  Filled 2020-05-28 (×6): qty 2

## 2020-05-28 NOTE — Progress Notes (Addendum)
   05/28/20 2152  Psych Admission Type (Psych Patients Only)  Admission Status Involuntary  Psychosocial Assessment  Patient Complaints Anxiety  Eye Contact Brief (pt was awakened from sleep)  Facial Expression Anxious  Affect Anxious  Speech Soft  Interaction Other (Comment) (pt in bed)  Motor Activity Other (Comment) (pt in bed)  Appearance/Hygiene Unremarkable  Behavior Characteristics Cooperative  Mood Anxious  Thought Process  Coherency Concrete thinking  Content WDL  Delusions WDL  Perception Hallucinations  Hallucination Auditory;Visual (states she sees shadows and hears noises like something bumping around)  Judgment Poor  Confusion None  Danger to Self  Current suicidal ideation? Denies  Danger to Others  Danger to Others None reported or observed   Pt denies SI, HI. Pt endorses AVH stating, "I see shadows. I hear bumps like someone is opening the door but when I turn, the door is still closed." Pt endorses pain 2/10. Pt has been sleeping so a little incoherent at times.

## 2020-05-28 NOTE — Progress Notes (Signed)
[facility] MD Progress Note  05/28/2020 7:02 AM Sylvia Arellano  MRN:  [id number] Subjective:   Sylvia Arellano is a 50 yr old female who presents under IVC for Manic symptoms and SI. PPHx is significant for Bipolar disorder and previous hospitalizations (last at [facility] was 2005).  She continues to act impaired and not wanting to answer questions. She continues to report being very tired. She reports continuing to have SI without a plan. She reports no HI. She reports continuing to have AVH but will not state what she is hearing or seeing. She reports sleeping ok last night and that her appetite is also ok. She asked about her Prozac and Gabapentin today. Discussed I would be restarting them. She reported that starting her medications last night had no ill effects. Encouraged her to attend group therapy and she stated she would. She had no other concerns at present.   Principal Problem: Bipolar 1 disorder (HCC) Diagnosis: Principal Problem:   Bipolar 1 disorder (HCC) Active Problems:   Cocaine abuse (HCC)   Alcohol abuse, daily use  Total Time spent with patient: 20 minutes  Past Psychiatric History: She reports a history of Bipolar Disorder. She reports multiple hospitalizations (last hospitalized at [facility] in 2005).   Past Medical History:  Past Medical History:  Diagnosis Date  . Bipolar 2 disorder (HCC)   . Seizures (HCC)   . Suicide attempt [facility])    History reviewed. No pertinent surgical history. Family History: History reviewed. No pertinent family history. Family Psychiatric  History: Mother- Bipolar Social History:  Social History   Substance and Sexual Activity  Alcohol Use Yes   Comment: "twice a week"     Social History   Substance and Sexual Activity  Drug Use Yes  . Types: Cocaine    Social History   Socioeconomic History  . Marital status: Single    Spouse name: Not on file  . Number of children: Not on file  . Years of education: Not on file  . Highest education level: Not  on file  Occupational History  . Not on file  Tobacco Use  . Smoking status: Current Every Day Smoker    Packs/day: 0.50  . Smokeless tobacco: Never Used  Vaping Use  . Vaping Use: Never used  Substance and Sexual Activity  . Alcohol use: Yes    Comment: "twice a week"  . Drug use: Yes    Types: Cocaine  . Sexual activity: Yes    Birth control/protection: Condom  Other Topics Concern  . Not on file  Social History Narrative  . Not on file   Social Determinants of Health   Financial Resource Strain: Not on file  Food Insecurity: Not on file  Transportation Needs: Not on file  Physical Activity: Not on file  Stress: Not on file  Social Connections: Not on file   Additional Social History:                         Sleep: Fair  Appetite:  Fair  Current Medications: Current Facility-Administered Medications  Medication Dose Route Frequency Provider Last Rate Last Admin  . acetaminophen (TYLENOL) tablet 650 mg  650 mg Oral Q6H PRN Jaclyn Shaggy, PA-C      . alum & mag hydroxide-simeth (MAALOX/MYLANTA) 200-200-20 MG/5ML suspension 30 mL  30 mL Oral Q4H PRN Melbourne Abts W, PA-C      . divalproex (DEPAKOTE ER) 24 hr tablet 1,000 mg  1,000 mg Oral QHS Lauro Franklin, MD   1,000 mg at 05/27/20 2058  . hydrOXYzine (ATARAX/VISTARIL) tablet 25 mg  25 mg Oral TID PRN Jaclyn Shaggy, PA-C   25 mg at 05/27/20 1702  . magnesium hydroxide (MILK OF MAGNESIA) suspension 30 mL  30 mL Oral Daily PRN Melbourne Abts W, PA-C      . QUEtiapine (SEROQUEL) tablet 100 mg  100 mg Oral QHS Lauro Franklin, MD   100 mg at 05/27/20 2058  . traZODone (DESYREL) tablet 50 mg  50 mg Oral QHS PRN Jaclyn Shaggy, PA-C   50 mg at 05/27/20 2058    Lab Results:  Results for orders placed or performed during the hospital encounter of 05/26/20 (from the past 48 hour(s))  Hemoglobin A1c     Status: None   Collection Time: 05/27/20  6:31 AM  Result Value Ref Range   Hgb A1c MFr Bld 5.1  4.8 - 5.6 %    Comment: (NOTE) Pre diabetes:          5.7%-6.4%  Diabetes:              >6.4%  Glycemic control for   <7.0% adults with diabetes    Mean Plasma Glucose 99.67 mg/dL    Comment: Performed at [facility], [address] [place], [place] [address]  Lipid panel     Status: None   Collection Time: 05/27/20  6:31 AM  Result Value Ref Range   Cholesterol 155 0 - 200 mg/dL   Triglycerides 77 <798 mg/dL   HDL 68 >92 mg/dL   Total CHOL/HDL Ratio 2.3 RATIO   VLDL 15 0 - 40 mg/dL   LDL Cholesterol 72 0 - 99 mg/dL    Comment:        Total Cholesterol/HDL:CHD Risk Coronary Heart Disease Risk Table                     Men   Women  1/2 Average Risk   3.4   3.3  Average Risk       5.0   4.4  2 X Average Risk   9.6   7.1  3 X Average Risk  23.4   11.0        Use the calculated Patient Ratio above and the CHD Risk Table to determine the patient's CHD Risk.        ATP III CLASSIFICATION (LDL):  <100     mg/dL   Optimal  119-417  mg/dL   Near or Above                    Optimal  130-159  mg/dL   Borderline  408-144  mg/dL   High  >818     mg/dL   Very High Performed at [facility], [address] [place], [place] [address]   TSH     Status: None   Collection Time: 05/27/20  6:31 AM  Result Value Ref Range   TSH 2.505 0.350 - 4.500 uIU/mL    Comment: Performed by a 3rd Generation assay with a functional sensitivity of <=0.01 uIU/mL. Performed at [facility], [address] [place], [place] [address]     Blood Alcohol level:  Lab Results  Component Value Date   ETH 134 (H) 05/26/2020   ETH 11 (H) 07/09/2014    Metabolic Disorder Labs: Lab Results  Component Value Date  HGBA1C 5.1 05/27/2020   MPG 99.67 05/27/2020   MPG 117 07/10/2014   No results found for: PROLACTIN Lab Results  Component Value Date   CHOL 155 05/27/2020   TRIG 77 05/27/2020   HDL 68 05/27/2020   CHOLHDL 2.3 05/27/2020   VLDL 15  05/27/2020   LDLCALC 72 05/27/2020   LDLCALC 68 07/10/2014    Physical Findings: AIMS:  , ,  ,  ,    CIWA:    COWS:     Musculoskeletal: Strength & Muscle Tone: within normal limits Gait & Station: normal Patient leans: N/A  Psychiatric Specialty Exam: Physical Exam Vitals and nursing note reviewed.  Constitutional:      General: She is not in acute distress.    Appearance: Normal appearance. She is normal weight. She is not ill-appearing, toxic-appearing or diaphoretic.  HENT:     Head: Normocephalic and atraumatic.  Cardiovascular:     Rate and Rhythm: Normal rate.  Pulmonary:     Effort: Pulmonary effort is normal.  Musculoskeletal:        General: Normal range of motion.  Neurological:     General: No focal deficit present.     Mental Status: She is alert.     Review of Systems  Constitutional: Positive for fatigue. Negative for fever.  Respiratory: Negative for chest tightness and shortness of breath.   Cardiovascular: Negative for chest pain and palpitations.  Gastrointestinal: Negative for abdominal pain, diarrhea, nausea and vomiting.    Blood pressure 100/80, pulse 88, temperature 97.9 F (36.6 C), temperature source Oral, resp. rate 18, height 5\' 2"  (1.575 m), weight 57.6 kg, SpO2 99 %.Body mass index is 23.23 kg/m.  General Appearance: Casual  Eye Contact:  Fair  Speech:  Clear and Coherent and Normal Rate  Volume:  Decreased  Mood:  Flat  Affect:  Flat  Thought Process:  Coherent  Orientation:  Full (Time, Place, and Person)  Thought Content:  Logical  Suicidal Thoughts:  Yes.  without intent/plan  Homicidal Thoughts:  No  Memory:  Immediate;   Fair Recent;   Fair  Judgement:  Impaired  Insight:  Lacking  Psychomotor Activity:  Normal  Concentration:  Concentration: Fair and Attention Span: Fair  Recall:  of Knowledge:  Fair  Language:  Good  Akathisia:  Negative  Handed:  Right  AIMS (if indicated):     Assets:   Resilience Social Support  ADL's:  Intact  Cognition:  Impaired,  Mild  Sleep:  Number of Hours: 7     Treatment Plan Summary: Daily contact with patient to assess and evaluate symptoms and progress in treatment She continues to act like she is withdrawing. She has tolerated restarting her medications. Will continue to monitor for signs of Mania. Will increase her Seroquel and have restarted her Prozac and Gabapentin.   Bipolar Disorder: -Restart Depakote ER 1000 mg QHS for mood stabilization -Increase Seroquel to 200 mg QHS for mood stabilization and AVH -Restart Prozac 40 mg daily for depression/SI -Restart Gabapentin 1200 mg TID for Mood Stability   -Continue PRN's: Tylenol, Maalox, Atarax, Milk of Magnesia, Trazodone [employer], MD 05/28/2020, 7:02 AM

## 2020-05-28 NOTE — Progress Notes (Signed)
   05/28/20 2242  Vital Signs  Temp (!) 103.1 F (39.5 C)  Temp Source Oral   Reported increased temperature to Press photographer. Pt denying feeling poorly in any way. Pt a little wobbly after being in bed. "Oh that's from the medicine." Pt eating a snack and drinking apple juice. AC has gotten an order for a COVID swab. Will inform pt and conduct test when ready.

## 2020-05-28 NOTE — Progress Notes (Signed)
   05/28/20 0630  Vital Signs  Pulse Rate 88  Pulse Rate Source Dinamap  BP 100/80  Patient Position (if appropriate) Standing   D: Patient denies SI/HI. Pt. Reported "seeing things" Pt. Was anxious, depressed and labile. Pt. Rated anxiety 7/10 and depression 9/10. Pt. Stated "I want to get out of here1!" Pt. Was yelling in her room. A:  Patient took scheduled medicine. Pt. Had 25 mg of Vistaril for anxiety, but this did not work. Pt. Had an additional 25 mg of Vistaril and this did not work as she started to yell and cry in her room. Pt. Was given 2 mg of Ativan and this provided some relief although pt. Was tearful and labile on the phone after the ativan.  Support and encouragement provided Routine safety checks conducted every 15 minutes. Patient  Informed to notify staff with any concerns.   R:  Safety maintained.

## 2020-05-29 ENCOUNTER — Inpatient Hospital Stay (HOSPITAL_COMMUNITY): Payer: Federal, State, Local not specified - Other

## 2020-05-29 LAB — RESP PANEL BY RT-PCR (FLU A&B, COVID) ARPGX2
Influenza A by PCR: NEGATIVE
Influenza B by PCR: NEGATIVE
SARS Coronavirus 2 by RT PCR: POSITIVE — AB

## 2020-05-29 MED ORDER — IBUPROFEN 200 MG PO TABS: 400.0000 mg | ORAL_TABLET | Freq: Four times a day (QID) | ORAL | Status: DC | PRN

## 2020-05-29 MED ORDER — FLUOXETINE HCL 20 MG PO CAPS
40.0000 mg | ORAL_CAPSULE | Freq: Every day | ORAL | Status: DC
  Administered 2020-05-30: 40 mg via ORAL
  Filled 2020-05-29: qty 2

## 2020-05-29 MED ORDER — GABAPENTIN 300 MG PO CAPS
1200.0000 mg | ORAL_CAPSULE | Freq: Three times a day (TID) | ORAL | Status: DC
Start: 2020-05-29 — End: 2020-05-30
  Administered 2020-05-29 – 2020-05-30 (×4): 1200 mg via ORAL
  Filled 2020-05-29 (×4): qty 4

## 2020-05-29 MED ORDER — ACETAMINOPHEN 325 MG PO TABS
650.0000 mg | ORAL_TABLET | Freq: Four times a day (QID) | ORAL | Status: DC | PRN
  Administered 2020-05-29: 650 mg via ORAL
  Filled 2020-05-29: qty 2

## 2020-05-29 NOTE — ED Notes (Signed)
Charge nurse Reita Cliche) has been made aware that pt needs a sitter. On monitor x3. Belongings placed in a bag an  Placed at nurses station.

## 2020-05-29 NOTE — Progress Notes (Addendum)
Pt covid swab results are positive. Contacted AC. Pt will be transferred to hospital. Pt informed of positive result and the need to stay in her room until transfer. Pt still denying any physical symptoms.   [id number] - Notified pt emergency contact, Ellsworth Lennox [phone]) of her transfer to [facility].

## 2020-05-29 NOTE — ED Provider Notes (Signed)
[facility]-EMERGENCY DEPT Provider Note   CSN: [id number] Arrival date & time: 05/29/20  0214     History Chief Complaint  Patient presents with  . Covid Positive    Pt to the ER from mental health facility after testing positive for COVID and reported fever of 103. Pt is IVC'ed for suicidal thoughts and actions. Upon arrival temp 100.4 orally. Pt unable to answer questions appropriately d/t being heavily medicated.    Sylvia Arellano is a 50 y.o. female.  Was at [facility] for psychiatric care. Diagnosed with COVID. Aside from fever, is asymptomatic at this time.    Mental Health Problem Presenting symptoms: aggressive behavior, bizarre behavior and paranoid behavior   Associated symptoms: no fatigue   URI Presenting symptoms: fever   Presenting symptoms: no congestion, no cough, no ear pain, no facial pain, no fatigue, no rhinorrhea and no sore throat   Severity:  Mild Onset quality:  Gradual Timing:  Constant Chronicity:  New Relieved by:  None tried Worsened by:  Nothing Ineffective treatments:  None tried      Past Medical History:  Diagnosis Date  . Bipolar 2 disorder (HCC)   . Seizures (HCC)   . Suicide attempt [facility])     Patient Active Problem List   Diagnosis Date Noted  . Bipolar 1 disorder (HCC) 05/26/2020  . Bipolar I disorder (HCC)   . Bipolar disord, crnt episode manic w/o psych features, mod (HCC) 01/17/2018  . Generalized anxiety disorder 01/17/2018  . Reaction to severe stress, unspecified 01/17/2018  . Personality disorder, unspecified (HCC) 01/17/2018  . Cocaine dependence, uncomplicated (HCC) 01/17/2018  . Cocaine use   . Encephalopathy, hypertensive   . TIA (transient ischemic attack) 07/09/2014  . Hypertensive urgency 07/09/2014  . Cocaine abuse (HCC) 07/09/2014  . Alcohol abuse, daily use 07/09/2014  . Tobacco abuse 07/09/2014  . History of seizure 07/09/2014  . Bipolar 2 disorder (HCC) 07/09/2014  . Hypokalemia 07/09/2014   . Cerebral infarction due to unspecified mechanism     History reviewed. No pertinent surgical history.   OB History   No obstetric history on file.     History reviewed. No pertinent family history.  Social History   Tobacco Use  . Smoking status: Current Every Day Smoker    Packs/day: 0.50  . Smokeless tobacco: Never Used  Vaping Use  . Vaping Use: Never used  Substance Use Topics  . Alcohol use: Yes    Comment: "twice a week"  . Drug use: Yes    Types: Cocaine    Home Medications Prior to Admission medications   Medication Sig Start Date End Date Taking? Authorizing Provider  FLUoxetine (PROZAC) 20 MG tablet Take 20 mg by mouth 2 (two) times daily. PT takes 20 mg BID   Yes [provider]  gabapentin (NEURONTIN) 400 MG capsule Take 1,200 mg by mouth 3 (three) times daily. Pt takes 1200 mg TID   Yes [provider]  hydrOXYzine (VISTARIL) 100 MG capsule Take 100 mg by mouth 2 (two) times daily.   Yes [provider]  ibuprofen (ADVIL) 200 MG tablet Take 400 mg by mouth every 6 (six) hours as needed for fever, headache or mild pain.   Yes [provider]  divalproex (DEPAKOTE ER) 500 MG 24 hr tablet Take 1,000 mg by mouth daily. Pt takes 1000 mg at bedtime    [provider]    Allergies    Patient has no known allergies.  Review of Systems   Review of Systems  Constitutional: Positive for fever. Negative for fatigue.  HENT: Negative for congestion, ear pain, rhinorrhea and sore throat.   Respiratory: Negative for cough.   Psychiatric/Behavioral: Positive for paranoia.  All other systems reviewed and are negative.   Physical Exam Updated Vital Signs BP 126/82 (BP Location: Left Arm)   Pulse (!) 122   Temp (!) 100.4 F (38 C) (Oral)   Resp 19   Ht 5\' 2"  (1.575 m)   Wt 57.6 kg   LMP  (LMP Unknown)   SpO2 97%   BMI 23.23 kg/m   Physical Exam Vitals and nursing note reviewed.  Constitutional:       Appearance: She is well-developed and well-nourished.  HENT:     Head: Normocephalic and atraumatic.     Mouth/Throat:     Mouth: Mucous membranes are moist.  Eyes:     Pupils: Pupils are equal, round, and reactive to light.  Cardiovascular:     Rate and Rhythm: Regular rhythm. Tachycardia present.  Pulmonary:     Effort: No respiratory distress.     Breath sounds: No stridor.  Abdominal:     General: There is no distension.  Musculoskeletal:        General: No swelling, tenderness or deformity. Normal range of motion.     Cervical back: Normal range of motion.  Skin:    General: Skin is warm and dry.  Neurological:     General: No focal deficit present.     Mental Status: She is alert.     ED Results / Procedures / Treatments   Labs (all labs ordered are listed, but only abnormal results are displayed) Labs Reviewed  RESP PANEL BY RT-PCR (FLU A&B, COVID) ARPGX2 - Abnormal; Notable for the following components:      Result Value   SARS Coronavirus 2 by RT PCR POSITIVE (*)    All other components within normal limits  HEMOGLOBIN A1C  LIPID PANEL  TSH  VALPROIC ACID LEVEL    EKG None  Radiology No results found.  Procedures Procedures (including critical care time)  Medications Ordered in ED Medications  acetaminophen (TYLENOL) tablet 650 mg (has no administration in time range)  alum & mag hydroxide-simeth (MAALOX/MYLANTA) 200-200-20 MG/5ML suspension 30 mL (has no administration in time range)  magnesium hydroxide (MILK OF MAGNESIA) suspension 30 mL (30 mLs Oral Given 05/28/20 1610)  hydrOXYzine (ATARAX/VISTARIL) tablet 25 mg (25 mg Oral Given 05/28/20 1055)  traZODone (DESYREL) tablet 50 mg (50 mg Oral Given 05/28/20 2047)  divalproex (DEPAKOTE ER) 24 hr tablet 1,000 mg (1,000 mg Oral Given 05/28/20 2047)  gabapentin (NEURONTIN) tablet 1,200 mg (1,200 mg Oral Given 05/28/20 1612)  FLUoxetine (PROZAC) tablet 40 mg (40 mg Oral Given 05/28/20 1054)  QUEtiapine (SEROQUEL)  tablet 200 mg (200 mg Oral Given 05/28/20 2047)  QUEtiapine (SEROQUEL) tablet 50 mg (has no administration in time range)  haloperidol (HALDOL) tablet 5 mg (has no administration in time range)    Or  haloperidol lactate (HALDOL) injection 5 mg (has no administration in time range)  LORazepam (ATIVAN) tablet 2 mg (has no administration in time range)    Or  LORazepam (ATIVAN) injection 2 mg (has no administration in time range)  divalproex (DEPAKOTE) DR tablet 500 mg (500 mg Oral Given 05/28/20 1053)  QUEtiapine (SEROQUEL) tablet 100 mg (100 mg Oral Given 05/28/20 1054)  white petrolatum (VASELINE) gel (  Given 05/28/20 1056)  hydrOXYzine (ATARAX/VISTARIL) tablet  25 mg (25 mg Oral Given 05/28/20 1403)  LORazepam (ATIVAN) injection 2 mg (2 mg Intramuscular Given 05/28/20 1648)  LORazepam (ATIVAN) 2 MG/ML injection (0 mg  Duplicate 05/28/20 1648)    ED Course  I have reviewed the triage vital signs and the nursing notes.  Pertinent labs & imaging results that were available during my care of the patient were reviewed by me and considered in my medical decision making (see chart for details).    MDM Rules/Calculators/A&P                          Discussed with cory taylor with [facility]:  Plan for continued IVC Medical care per ED: no indication for medical admission at this time.  Plan for continued psychiatric management per notes and orders already placed.  Final Clinical Impression(s) / ED Diagnoses Final diagnoses:  None    Rx / DC Orders ED Discharge Orders    None       Mycah Mcdougall, Barbara Cower, MD 05/29/20 [phone]

## 2020-05-29 NOTE — Progress Notes (Signed)
Reviewed patient's psychotropic medication regimen that she was receiving at [facility] prior to her transfer to [facility] ED from [facility]. Spoke with Dr. Clayborne Dana via phone regarding the patient's current status in the ED. Notified Dr. Clayborne Dana that patient's psychiatric medication regimen will remain as it currently is for her active medication orders while she is in the ED. Dr. Clayborne Dana notified me that as of now, patient will stay in the ED for her COVID treatment and will only be admitted to the medical floor if necessary. Notified Dr. Clayborne Dana to place a new TTS consult so that the patient will continue to be followed by psychiatry each day while she is in the ED. Notified Dr. Clayborne Dana that if the patient is admitted to the medical floor, then the hospitalist taking care of the patient will need to place a Psychiatry consult at that time. Dr. Clayborne Dana verbalizes understanding and agreement and has placed a TTS consult for the patient.

## 2020-05-29 NOTE — ED Triage Notes (Signed)
Pt to the ER from mental health facility after testing positive for COVID and reported fever of 103. Pt is IVC'ed for suicidal thoughts and actions. Upon arrival temp 100.4 orally. Pt unable to answer questions appropriately d/t being heavily medicated.

## 2020-05-29 NOTE — ED Notes (Signed)
X ray is with pt at bedside. 

## 2020-05-29 NOTE — Social Work (Signed)
CSW contacted by floor nurse enquiring about d/c plan.   There is no consult in as of yet for TOC. CSW will continue to follow for possible d/c need.s

## 2020-05-30 ENCOUNTER — Telehealth: Payer: Self-pay

## 2020-05-30 ENCOUNTER — Encounter (HOSPITAL_COMMUNITY): Payer: Self-pay | Admitting: Psychiatry

## 2020-05-30 NOTE — Telephone Encounter (Signed)
Pt. Currently hospitalized. Pt. On MAB list. Unable to speak with pt. Do to hospitalization - not appropriate.

## 2020-05-30 NOTE — BH Assessment (Signed)
This Clinical research associate contacted patient's roommate Stephannie Peters [phone] with patient's permission who reports he has no safety concerns in reference to patient being discharged this date. Stephannie Peters states he will assist with patient being transported back to their residence if patient requests. Stephannie Peters was informed of patient's COVID status and precautions. Stephannie Peters states he is currently vaccinated and will follow precautions.

## 2020-05-30 NOTE — Discharge Instructions (Addendum)
For your behavioral health needs, you are advised to follow up with [facility]:       [facility]      [address]      [place], [place] [address]      [phone]      Ask about their Substance Abuse Intensive Outpatient Program.  They also offer psychiatry/medication management and therapy.  New patients are being seen in their walk-in clinic.  Walk-in hours are Monday - Thursday from 8:00 am - 11:00 am for psychiatry, and Friday from 1:00 pm - 4:00 pm for therapy.  Walk-in patients are seen on a first come, first served basis, so try to arrive as early as possible for the best chance of being seen the same day.  Please wait until you have completed Covid isolation before going to [facility].  

## 2020-05-30 NOTE — BH Assessment (Addendum)
Per Shuvon Rankin, NP, this pt does not require psychiatric hospitalization at this time.  Pt presents under IVC initiated by pt's daughter and upheld by EDP Lynden Oxford, MD, which has been rescinded by Nelly Rout, MD.  Pt is psychiatrically cleared.  Discharge instructions include referral information for [facility], advising pt to wait until after clearing from Covid isolation before presenting for walk-in services.  EDP Gwyneth Sprout, MD and pt's nurse, Jonny Ruiz, have been notified.  Doylene Canning, [place] Triage Specialist [phone] Doylene Canning, [place] Triage Specialist [phone]

## 2020-05-30 NOTE — Consult Note (Signed)
Psychiatric Consult  Patient location:  [facility] ED Provider Location:  [facility]   Sylvia Arellano, 50 y.o., female patient seen via tele psych by this provider and TTS, consulted with Dr. Lucianne Muss; and chart reviewed on 05/30/20.  On evaluation Sylvia Arellano reports she has been off her psychotropic medications for about a month related to not getting them for you.  Reports that her roommate Stephannie Peters has picked up her medication since she has been in the hospital.  At this time patient denies suicidal ideation.  Reports she would need to be set up with outpatient provider.  Patient is also interested in IOP or PHP program.   During evaluation Sylvia Arellano is sitting up in bed in no acute distress.  She is alert, oriented x 4, calm and cooperative.  Her mood is depressed but appropriate and congruent with affect.  She does not appear to be responding to internal/external stimuli or delusional thoughts.  Patient denies suicidal/self-harm/homicidal ideation, psychosis, and paranoia.  Patient answered question appropriately.  Patient gave permission to speak with her friend Stephannie Peters at [phone].  TTS will contact for collateral information.   For full details see TTS assessment note.   Disposition:  Psychiatrically clear after collateral information  No evidence of imminent risk to self or others at present.   Patient does not meet criteria for psychiatric inpatient admission. Supportive therapy provided about ongoing stressors. Refer to IOP. Discussed crisis plan, support from social network, calling 911, coming to the Emergency Department, and calling Suicide Hotline.    Secure message sent to Dr. Effie Shy informing:  Psychiatrically cleared.  Will need to be set up for [facility] program prior to discharge and TTS is also getting collateral information.  Dr. Effie Shy patient will need at least one prescription for psychotropic medications for a month (Seroquel, Depakote, Trazodone, Prozac, Vistaril, and Neurontin)

## 2020-06-01 ENCOUNTER — Telehealth (HOSPITAL_COMMUNITY): Payer: Self-pay | Admitting: General Practice

## 2020-06-01 NOTE — Telephone Encounter (Signed)
Care Management - Follow Up [facility] Discharges   Writer attempted to make contact with patient today and was unsuccessful.  Patient did not have a voicemail.

## 2020-06-08 ENCOUNTER — Encounter: Payer: Self-pay | Admitting: Neurology

## 2020-07-19 ENCOUNTER — Ambulatory Visit: Payer: Self-pay | Admitting: *Deleted

## 2020-07-19 ENCOUNTER — Ambulatory Visit: Payer: Self-pay | Attending: Critical Care Medicine

## 2020-07-19 DIAGNOSIS — Z23 Encounter for immunization: Secondary | ICD-10-CM

## 2020-07-19 NOTE — Progress Notes (Signed)
Patient presents for vaccine injection today. Patient tolerated injection well and was observed without any concerns.  

## 2020-07-19 NOTE — Progress Notes (Signed)
   Covid-19 Vaccination Clinic  Name:  Sylvia Arellano    MRN: [id number] DOB: 23-Jan-1971  07/19/2020  [name] was observed post Covid-19 immunization for 15 minutes without incident. She was provided with Vaccine Information Sheet and instruction to access the V-Safe system.   [name] was instructed to call 911 with any severe reactions post vaccine: Marland Kitchen Difficulty breathing  . Swelling of face and throat  . A fast heartbeat  . A bad rash all over body  . Dizziness and weakness   Immunizations Administered    Name Date Dose VIS Date Route   Moderna COVID-19 Vaccine 07/19/2020  2:42 PM 0.5 mL 03/09/2020 Intramuscular   Manufacturer: Gala Murdoch   Lot: [id number]   NDC: 41962-229-79   Moderna COVID-19 Vaccine 07/19/2020  2:38 PM 0.5 mL 03/09/2020 Intramuscular   Manufacturer: Moderna   Lot: [id number]   NDC: 17408-144-81

## 2020-08-18 ENCOUNTER — Ambulatory Visit: Payer: Self-pay | Admitting: Neurology

## 2020-11-18 ENCOUNTER — Ambulatory Visit: Payer: Self-pay | Admitting: Neurology

## 2020-12-07 ENCOUNTER — Ambulatory Visit (HOSPITAL_COMMUNITY)
Admission: EM | Admit: 2020-12-07 | Discharge: 2020-12-08 | Disposition: A | Discharge status: Home / Self Care | Payer: No Payment, Other | Attending: Student | Admitting: Student

## 2020-12-07 ENCOUNTER — Other Ambulatory Visit: Payer: Self-pay

## 2020-12-07 DIAGNOSIS — F17210 Nicotine dependence, cigarettes, uncomplicated: Secondary | ICD-10-CM | POA: Insufficient documentation

## 2020-12-07 DIAGNOSIS — F319 Bipolar disorder, unspecified: Secondary | ICD-10-CM | POA: Insufficient documentation

## 2020-12-07 DIAGNOSIS — F1010 Alcohol abuse, uncomplicated: Secondary | ICD-10-CM | POA: Insufficient documentation

## 2020-12-07 DIAGNOSIS — Z20822 Contact with and (suspected) exposure to covid-19: Secondary | ICD-10-CM | POA: Insufficient documentation

## 2020-12-07 DIAGNOSIS — Z9151 Personal history of suicidal behavior: Secondary | ICD-10-CM | POA: Insufficient documentation

## 2020-12-07 DIAGNOSIS — F3181 Bipolar II disorder: Secondary | ICD-10-CM

## 2020-12-07 DIAGNOSIS — F1490 Cocaine use, unspecified, uncomplicated: Secondary | ICD-10-CM | POA: Insufficient documentation

## 2020-12-07 LAB — POCT URINE DRUG SCREEN - MANUAL ENTRY (I-SCREEN)
POC Amphetamine UR: NOT DETECTED
POC Buprenorphine (BUP): NOT DETECTED
POC Cocaine UR: POSITIVE — AB
POC Marijuana UR: NOT DETECTED
POC Methadone UR: NOT DETECTED
POC Methamphetamine UR: NOT DETECTED
POC Morphine: NOT DETECTED
POC Oxazepam (BZO): NOT DETECTED
POC Oxycodone UR: NOT DETECTED
POC Secobarbital (BAR): NOT DETECTED

## 2020-12-07 LAB — POC SARS CORONAVIRUS 2 AG -  ED: SARS Coronavirus 2 Ag: NEGATIVE

## 2020-12-07 MED ORDER — ALUM & MAG HYDROXIDE-SIMETH 200-200-20 MG/5ML PO SUSP: 30.0000 mL | ORAL | Status: DC | PRN

## 2020-12-07 MED ORDER — HYDROXYZINE HCL 25 MG PO TABS
25.0000 mg | ORAL_TABLET | Freq: Three times a day (TID) | ORAL | Status: DC | PRN
  Administered 2020-12-08: 25 mg via ORAL
  Filled 2020-12-07: qty 1
  Filled 2020-12-07: qty 10
  Filled 2020-12-07: qty 1

## 2020-12-07 MED ORDER — ACETAMINOPHEN 325 MG PO TABS
650.0000 mg | ORAL_TABLET | Freq: Four times a day (QID) | ORAL | Status: DC | PRN
  Administered 2020-12-08: 650 mg via ORAL
  Filled 2020-12-07: qty 2

## 2020-12-07 MED ORDER — ONDANSETRON 4 MG PO TBDP: 4.0000 mg | ORAL_TABLET | Freq: Four times a day (QID) | ORAL | Status: DC | PRN

## 2020-12-07 MED ORDER — OLANZAPINE 5 MG PO TABS
5.0000 mg | ORAL_TABLET | Freq: Every day | ORAL | Status: DC
  Administered 2020-12-08: 5 mg via ORAL
  Filled 2020-12-07: qty 1
  Filled 2020-12-07: qty 7

## 2020-12-07 MED ORDER — TRAZODONE HCL 50 MG PO TABS
50.0000 mg | ORAL_TABLET | Freq: Every evening | ORAL | Status: DC | PRN
  Administered 2020-12-08: 50 mg via ORAL
  Filled 2020-12-07: qty 1

## 2020-12-07 MED ORDER — THIAMINE HCL 100 MG PO TABS
100.0000 mg | ORAL_TABLET | Freq: Every day | ORAL | Status: DC
  Administered 2020-12-08: 100 mg via ORAL
  Filled 2020-12-07: qty 7
  Filled 2020-12-07: qty 1

## 2020-12-07 MED ORDER — DIVALPROEX SODIUM ER 250 MG PO TB24
250.0000 mg | ORAL_TABLET | Freq: Every day | ORAL | Status: DC
  Administered 2020-12-08: 250 mg via ORAL
  Filled 2020-12-07: qty 7
  Filled 2020-12-07: qty 1

## 2020-12-07 MED ORDER — ADULT MULTIVITAMIN W/MINERALS CH
1.0000 | ORAL_TABLET | Freq: Every day | ORAL | Status: DC
  Administered 2020-12-08: 1 via ORAL
  Filled 2020-12-07: qty 1

## 2020-12-07 MED ORDER — LOPERAMIDE HCL 2 MG PO CAPS: 2.0000 mg | ORAL_CAPSULE | ORAL | Status: DC | PRN

## 2020-12-07 MED ORDER — MAGNESIUM HYDROXIDE 400 MG/5ML PO SUSP: 30.0000 mL | Freq: Every day | ORAL | Status: DC | PRN

## 2020-12-07 MED ORDER — THIAMINE HCL 100 MG/ML IJ SOLN
100.0000 mg | Freq: Once | INTRAMUSCULAR | Status: AC
  Administered 2020-12-08: 100 mg via INTRAMUSCULAR
  Filled 2020-12-07: qty 2

## 2020-12-07 MED ORDER — LORAZEPAM 1 MG PO TABS: 1.0000 mg | ORAL_TABLET | Freq: Four times a day (QID) | ORAL | Status: DC | PRN

## 2020-12-07 NOTE — ED Provider Notes (Addendum)
Behavioral Health Admission [facility]&P [facility] & OBS)  Date: 12/07/20 Patient Name: Sylvia Arellano MRN: [id number] Chief Complaint: No chief complaint on file.     Diagnoses:  Final diagnoses:  Bipolar 1 disorder (HCC)    HPI: Sylvia Arellano is a 50 year old female with past psychiatric history of bipolar 1 disorder, alcohol abuse, and cocaine abuse, as well as history of seizures, who presents to the [facility] as a voluntary walk-in unaccompanied.  On exam, patient is very tearful.  When patient is asked why she came to [facility], patient states that she is "having an episode".  Patient states that she has been feeling very depressed recently due to the anniversary of her mother's death approaching on 01-18-2023.  Patient denies SI.  Patient endorses history of multiple past suicide attempts, but when patient is asked to provide further details regarding her past suicide attempts, such as when this suicide attempts occurred and what the attempts consisted of, patient states that she cannot remember the details.  Patient reports history of self-injurious behavior via cutting, but patient states that she has not cut herself for the past 15 years.  Patient denies any history of intentionally burning herself.  Patient denies HI.  When patient is asked about AVH, patient states "I'm not sure".  When patient was asked to describe this further, patient does not provide further details about AVH.  Patient denies paranoia.  Patient reports poor sleep, stating that she sleeps about 1 hour per night.  Patient states that her poor sleep is not due to feeling of lack of need for sleep, but she attributes that to her having an unstable housing situation.  Patient endorses anhedonia and feelings of guilt, hopelessness, and worthlessness.  Patient reports poor energy/fatigue as well as poor concentration and focus.  Patient reports decreased appetite but denies weight changes.  Patient endorses being easily distracted.  Patient denies  engaging in risky behavior such as excessive spending.  Patient denies thoughts of grandiosity, but does endorse flight of ideas.  Patient denies any additional symptoms of mania at this time.  Per chart review, patient presented to [facility] on 05/26/2020, was recommended for inpatient psychiatric treatment and was placed on privacy and transferred to the emergency department.  Patient was then admitted to [facility] on 05/27/2020, but was then transferred to the [facility] emergency department on 05/29/2020 due to the patient tested positive for COVID and having fever.  Patient was reevaluated in the emergency department by psychiatry on 05/30/2020 and was psych cleared at that time.  Patient denies any additional inpatient psychiatric hospitalizations since this 05/2020 [facility] admission noted above.  Per chart review, it appears that patient has been prescribed/taking Depakote ER 1000 QHS mg, Prozac 20 mg, gabapentin 1,200 mg, and Vistaril 100 mg in the past.  While patient was admitted to [facility] in January 2022, it appears that patient was started on Seroquel 100 mg nightly.  Patient states that she has not taken any psychotropic medications for the past 6 to 7 months and patient is unable to recall the names or dosages of the psychotropic medications that she was taking 6 to 7 months ago.  Patient states that she was seeing a psychiatrist at [facility] in the past, but patient is unable to recall when the last time was she received treatment at [facility].  Patient does not have a therapist at this time.  Patient is not taking any home medications at this time.  Patient states that she is  homeless at this time.  Patient denies access to firearms or weapons.  Patient reports that she is currently drinking 3 20 ounce beers 3 times per week.  Patient states that her last alcohol consumption was on the evening of 12/06/2020 in which patient states she consumed 2 20 oz beers at that time.  Patient unable to recall how long she has been  drinking alcohol for her.  Patient denies history of withdrawal symptoms.  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  Patient states that she had an appointment with a neurologist scheduled a few months ago but she states that she missed this appointment.  Patient states that her last seizure was a few months ago.  Patient is unable to recall if she was taking seizure medication in the past.  Patient denies tobacco use.  Patient is not very forthcoming regarding questions about illicit substance use.  Initially, patient states that she is not using illicit substances but then states that she uses "not much" cocaine once per week, stating that she uses "no more than $20 worth" once a week and she states that she has been using cocaine for the past few years.  Patient denies additional substance use.  Patient states that she is currently employed at [employer].  PHQ 2-9:  [facility]  Thoughts that you would be better off dead, or of hurting yourself in some way Nearly every day  [Phreesia 05/26/2020]  PHQ-9 Total Score 24       Flowsheet Row ED from 05/26/2020 in [facility] Most recent reading at 05/26/2020 11:33 PM [facility] Most recent reading at 05/26/2020 10:46 AM  C-SSRS RISK CATEGORY Error: Q7 should not be populated when Q6 is No No Risk        Total Time spent with patient: 30 minutes  Musculoskeletal  Strength & Muscle Tone: within normal limits Gait & Station: normal Patient leans: N/A  Psychiatric Specialty Exam  Presentation General Appearance: Bizarre; Disheveled  Eye Contact:Fair; Fleeting  Speech:Pressured  Speech Volume:Decreased  Handedness:No data recorded  Mood and Affect  Mood:Irritable; Anxious;  Depressed  Affect:Congruent; Tearful; Inappropriate   Thought Process  Thought Processes:Disorganized  Descriptions of Associations:Tangential  Orientation:Full (Time, Place and Person)  Thought Content:Tangential; Scattered    Hallucinations:Hallucinations: -- (When asked about AVH, patient states "I'm not sure".)  Ideas of Reference:Delusions  Suicidal Thoughts:Suicidal Thoughts: No  Homicidal Thoughts:Homicidal Thoughts: No   Sensorium  Memory:Immediate Poor; Remote Poor; Recent Poor  Judgment:Poor  Insight:Lacking   Executive Functions  Concentration:Fair  Attention Span:Fair  Recall:Poor  Fund of Knowledge:Fair  Language:Fair   Psychomotor Activity  Psychomotor Activity:Psychomotor Activity: Increased; Restlessness   Assets  Assets:Communication Skills; Desire for Improvement; Leisure Time; Physical Health; Resilience; Vocational/Educational   Sleep  Sleep:Sleep: Poor Number of Hours of Sleep: 1   Nutritional Assessment (For OBS and FBC admissions only) Has the patient had a weight loss or gain of 10 pounds or more in the last 3 months?: No Has the patient had a decrease in food intake/or appetite?: Yes Does the patient have dental problems?: No Does the patient have eating habits or behaviors that may be indicators of an eating disorder including binging or inducing vomiting?: No Has the patient recently lost weight without trying?: No Has the patient been eating poorly  because of a decreased appetite?: Yes Malnutrition Screening Tool Score: 1   Physical Exam Vitals reviewed.  Constitutional:      General: She is not in acute distress.    Appearance: She is not ill-appearing, toxic-appearing or diaphoretic.  HENT:     Head: Normocephalic and atraumatic.     Right Ear: External ear normal.     Left Ear: External ear normal.     Nose: Nose normal.  Eyes:     General:        Right eye: No discharge.        Left eye: No discharge.      Conjunctiva/sclera: Conjunctivae normal.  Cardiovascular:     Rate and Rhythm: Normal rate.  Pulmonary:     Effort: Pulmonary effort is normal. No respiratory distress.  Musculoskeletal:        General: Normal range of motion.     Cervical back: Normal range of motion.  Neurological:     General: No focal deficit present.     Mental Status: She is alert and oriented to person, place, and time.     Comments: No tremor noted.   Psychiatric:        Speech: Speech is rapid and pressured.        Behavior: Behavior is hyperactive. Behavior is not agitated, slowed, aggressive, withdrawn or combative. Behavior is cooperative.        Thought Content: Thought content is not paranoid. Thought content does not include homicidal or suicidal ideation.     Comments: Mood is irritable, anxious, and depressed with congruent affect. When asked about AVH, patient states "I'm not sure".   Review of Systems  Constitutional:  Positive for malaise/fatigue. Negative for chills, diaphoresis, fever and weight loss.  HENT:  Negative for congestion.   Respiratory:  Negative for cough and shortness of breath.   Cardiovascular:  Negative for chest pain and palpitations.  Gastrointestinal:  Negative for abdominal pain, constipation, diarrhea, nausea and vomiting.  Musculoskeletal:  Negative for joint pain and myalgias.  Neurological:  Positive for seizures. Negative for dizziness, tremors and headaches.  Psychiatric/Behavioral:  Positive for depression and substance abuse. Negative for memory loss and suicidal ideas. The patient is nervous/anxious and has insomnia.        When asked about AVH, patient states "I'm not sure".  All other systems reviewed and are negative.  Vitals: Blood pressure (!) 170/106, pulse 84, temperature 99.3 F (37.4 C), temperature source Oral, resp. rate 18, SpO2 100 %. There is no height or weight on file to calculate BMI.  Past Psychiatric History: Bipolar 1 Disorder, cocaine abuse,  alcohol abuse, [facility] hospitalization in January 2022  Is the patient at risk to self? Yes  Has the patient been a risk to self in the past 6 months? No .    Has the patient been a risk to self within the distant past? No   Is the patient a risk to others? No   Has the patient been a risk to others in the past 6 months? No   Has the patient been a risk to others within the distant past? No   Past Medical History:  Past Medical History:  Diagnosis Date   Bipolar 2 disorder (HCC)    Seizures (HCC)    Suicide attempt (HCC)    No past surgical history on file.  Family History: No family history on file.  Social History:  Social History   Socioeconomic History   Marital  status: Single    Spouse name: Not on file   Number of children: Not on file   Years of education: Not on file   Highest education level: Not on file  Occupational History   Not on file  Tobacco Use   Smoking status: Every Day    Packs/day: 0.50    Types: Cigarettes   Smokeless tobacco: Never  Vaping Use   Vaping Use: Never used  Substance and Sexual Activity   Alcohol use: Yes    Comment: "twice a week"   Drug use: Yes    Types: Cocaine   Sexual activity: Yes    Birth control/protection: Condom  Other Topics Concern   Not on file  Social History Narrative   Not on file   Social Determinants of Health   Financial Resource Strain: Not on file  Food Insecurity: Not on file  Transportation Needs: Not on file  Physical Activity: Not on file  Stress: Not on file  Social Connections: Not on file  Intimate Partner Violence: Not on file    SDOH:  SDOH Screenings   Alcohol Screen: Low Risk    Last Alcohol Screening Score (AUDIT): 5  Depression (PHQ2-9): Medium Risk   PHQ-2 Score: 24  Financial Resource Strain: Not on file  Food Insecurity: Not on file  Housing: Not on file  Physical Activity: Not on file  Social Connections: Not on file  Stress: Not on file  Tobacco Use: High Risk   Smoking  Tobacco Use: Every Day   Smokeless Tobacco Use: Never  Transportation Needs: Not on file    Last Labs:  No visits with results within 6 Month(s) from this visit.  Latest known visit with results is:  Admission on 05/26/2020, Discharged on 05/30/2020  Component Date Value Ref Range Status   Hgb A1c MFr Bld 05/27/2020 5.1  4.8 - 5.6 % Final   Comment: (NOTE) Pre diabetes:          5.7%-6.4%  Diabetes:              >6.4%  Glycemic control for   <7.0% adults with diabetes    Mean Plasma Glucose 05/27/2020 99.67  mg/dL Final   Performed at [facility], [address], [place], [place] [address]   Cholesterol 05/27/2020 155  0 - 200 mg/dL Final   Triglycerides 95/62/1308 77  <150 mg/dL Final   HDL 65/78/4696 68  >40 mg/dL Final   Total CHOL/HDL Ratio 05/27/2020 2.3  RATIO Final   VLDL 05/27/2020 15  0 - 40 mg/dL Final   LDL Cholesterol 05/27/2020 72  0 - 99 mg/dL Final   Comment:        Total Cholesterol/HDL:CHD Risk Coronary Heart Disease Risk Table                     Men   Women  1/2 Average Risk   3.4   3.3  Average Risk       5.0   4.4  2 X Average Risk   9.6   7.1  3 X Average Risk  23.4   11.0        Use the calculated Patient Ratio above and the CHD Risk Table to determine the patient's CHD Risk.        ATP III CLASSIFICATION (LDL):  <100     mg/dL   Optimal  295-284  mg/dL   Near or Above  Optimal  130-159  mg/dL   Borderline  433-295  mg/dL   High  >188     mg/dL   Very High Performed at [facility], [address] [place], [place] [address]    TSH 05/27/2020 2.505  0.350 - 4.500 uIU/mL Final   Comment: Performed by a 3rd Generation assay with a functional sensitivity of <=0.01 uIU/mL. Performed at [facility], [address] [place], [place] [address]    SARS Coronavirus 2 by RT PCR 05/28/2020 POSITIVE (A) NEGATIVE Final   Comment: CRITICAL RESULT CALLED TO, READ BACK BY AND VERIFIED  WITH: Berna Spare RN 05/28/20 @1250  BY P.HENDERSON (NOTE) SARS-CoV-2 target nucleic acids are DETECTED.  The SARS-CoV-2 RNA is generally detectable in upper respiratory specimens during the acute phase of infection. Positive results are indicative of the presence of the identified virus, but do not rule out bacterial infection or co-infection with other pathogens not detected by the test. Clinical correlation with patient history and other diagnostic information is necessary to determine patient infection status. The expected result is Negative.  Fact Sheet for Patients:  Fact Sheet for Healthcare Providers: BloggerCourse.com  This test is not yet approved or cleared by the [employer]ious[employer].[place] FDA and  has been authorized for detection and/or diagnosis of SARS-CoV-2 by FDA under an Emergency Use Authorization (EUA).  This EUA will remain in effect (                          meaning this test can be used) for the duration of  the COVID-19 declaration under Section 564(b)(1) of the Act, 21 U.S.C. section 360bbb-3(b)(1), unless the authorization is terminated or revoked sooner.     Influenza A by PCR 05/28/2020 NEGATIVE  NEGATIVE Final   Influenza B by PCR 05/28/2020 NEGATIVE  NEGATIVE Final   Comment: (NOTE) The Xpert Xpress SARS-CoV-2/FLU/RSV plus assay is intended as an aid in the diagnosis of influenza from Nasopharyngeal swab specimens and should not be used as a sole basis for treatment. Nasal washings and aspirates are unacceptable for Xpert Xpress SARS-CoV-2/FLU/RSV testing.  Fact Sheet for Patients: 07/26/2020  Fact Sheet for Healthcare Providers: BloggerCourse.com  This test is not yet approved or cleared by the SeriousBroker.it FDA and has been authorized for detection and/or diagnosis of SARS-CoV-2 by FDA under an Emergency Use Authorization (EUA).  This EUA will remain in effect (meaning this test can be used) for the duration of the COVID-19 declaration under Section 564(b)(1) of the Act, 21 U.S.C. section 360bbb-3(b)(1), unless the authorization is terminated or revoked.  Performed at [facility], [address], [place], [place]     Allergies: Patient has no known allergies.  PTA Medications: (Not in a hospital admission)   Medical Decision Making  Patient is a 50 year old female with history of bipolar 1 disorder, cocaine abuse, and alcohol abuse, who presents to [facility] voluntarily.  On exam, patient is very tearful and appears to be very manic at this time.  Patient also appears to be intoxicated at this time.  Patient has also been off of psychotropic medications reportedly for the past 6 to 7 months and it appears that patient's bipolar disorder has decompensated severely at this point.  Based on patient's presentation, believe that patient is a potential threat to herself at this time and recommend continuous assessment for the patient.    Recommendations  Based on my evaluation the  patient does not appear to have an emergency medical condition. Patient will be placed in [facility] continuous assessment for further stabilization and treatment.  Patient will be reevaluated by the treatment team on 12/08/2020 and disposition to be determined at that time. Labs/tests ordered and reviewed:  -CBC with differential: Within normal limits  -CMP: Total protein slightly elevated at 8.4 g/dL.  CMP is otherwise unremarkable.  -Hemoglobin A1c, TSH, and lipid panel ordered to have on file for reinitiation of antipsychotic medication: Hemoglobin A1c elevated in the prediabetic range at 6.0%.  TSH within normal limits.  Lipid panel showed elevated cholesterol at 251 mg/dL, elevated triglycerides at 169 mg/dL, and elevated LDL cholesterol 122 mg/dL.  Lipid panel otherwise unremarkable.  -Ethanol: 127 mg/dL  -UDS:  Positive for cocaine  -Urine pregnancy: Negative  -EKG ordered to assess patient's baseline QTC for reinitiation of antipsychotic medication.  Patient's EKG shows normal sinus rhythm and shows no acute or concerning findings with QT/QTC of 404/460 ms.  Patient's QTC is appropriate for reinitiation of antipsychotic medication at this time.  Patient is not taking any home medications at this time and reports that she has not taken any psychotropic medications for the past 6 to 7 months and is unable to recall names or dosages of these medications.   -For bipolar disorder/mood stability and seizure coverage (patient reports history of seizures), will restart Depakote ER at 250 mg p.o. daily at bedtime due to patient likely not taking any Depakote for the past 6 to 7 months. -Will initiate Zyprexa 5 mg p.o. daily at bedtime for bipolar disorder.  Patient educated on side effect profile of Zyprexa and patient verbalizes understanding of this education.  Patient denies history of alcohol withdrawal symptoms.  However, due to patient's reported history of seizures will initiate CIWA protocol for potential development of alcohol withdrawal symptoms with the following:  -Imodium capsule 2 to 4 mg p.o. as needed for diarrhea or loose stools  -Ativan 1 mg p.o. every 6 hours as needed for CIWA greater than 10  -Multivitamin with minerals tablet 1 tablet p.o. daily for nutritional supplementation  -Zofran ODT 4 mg p.o. every 6 hours as needed for nausea/vomiting  -Thiamine tablet 100 mg p.o. daily for nutritional supplementation   -Trazodone 50 mg p.o. at bedtime as needed ordered for sleep -Vistaril 25 mg p.o. 3 times daily as needed ordered for anxiety  Although patient cannot recall what psychotropic medication she was taking 6 to 7 months ago, patient states that she has taken Depakote, trazodone, and Vistaril in the past and she states that she is aware of the side effect profile of these medications.    Jaclyn Shaggy, PA-C 12/07/20  11:27 PM

## 2020-12-08 ENCOUNTER — Encounter (HOSPITAL_COMMUNITY): Payer: Self-pay | Admitting: Emergency Medicine

## 2020-12-08 LAB — LIPID PANEL
Cholesterol: 251 mg/dL — ABNORMAL HIGH (ref 0–200)
HDL: 95 mg/dL (ref >40)
LDL Cholesterol: 122 mg/dL — ABNORMAL HIGH (ref 0–99)
Total CHOL/HDL Ratio: 2.6 RATIO
Triglycerides: 169 mg/dL — ABNORMAL HIGH (ref <150)
VLDL: 34 mg/dL (ref 0–40)

## 2020-12-08 LAB — COMPREHENSIVE METABOLIC PANEL
ALT: 32 U/L (ref 0–44)
AST: 36 U/L (ref 15–41)
Albumin: 4.8 g/dL (ref 3.5–5.0)
Alkaline Phosphatase: 75 U/L (ref 38–126)
Anion gap: 13 (ref 5–15)
BUN: 10 mg/dL (ref 6–20)
CO2: 21 mmol/L — ABNORMAL LOW (ref 22–32)
Calcium: 10 mg/dL (ref 8.9–10.3)
Chloride: 103 mmol/L (ref 98–111)
Creatinine, Ser: 0.76 mg/dL (ref 0.44–1.00)
GFR, Estimated: >60 mL/min (ref >60)
Glucose, Bld: 76 mg/dL (ref 70–99)
Potassium: 4.1 mmol/L (ref 3.5–5.1)
Sodium: 137 mmol/L (ref 135–145)
Total Bilirubin: 0.6 mg/dL (ref 0.3–1.2)
Total Protein: 8.4 g/dL — ABNORMAL HIGH (ref 6.5–8.1)

## 2020-12-08 LAB — CBC WITH DIFFERENTIAL/PLATELET
Abs Immature Granulocytes: 0.01 10*3/uL (ref 0.00–0.07)
Basophils Absolute: 0 10*3/uL (ref 0.0–0.1)
Basophils Relative: 1 %
Eosinophils Absolute: 0 10*3/uL (ref 0.0–0.5)
Eosinophils Relative: 0 %
HCT: 42.3 % (ref 36.0–46.0)
Hemoglobin: 14.1 g/dL (ref 12.0–15.0)
Immature Granulocytes: 0 %
Lymphocytes Relative: 48 %
Lymphs Abs: 3.1 10*3/uL (ref 0.7–4.0)
MCH: 29.2 pg (ref 26.0–34.0)
MCHC: 33.3 g/dL (ref 30.0–36.0)
MCV: 87.6 fL (ref 80.0–100.0)
Monocytes Absolute: 0.5 10*3/uL (ref 0.1–1.0)
Monocytes Relative: 7 %
Neutro Abs: 2.8 10*3/uL (ref 1.7–7.7)
Neutrophils Relative %: 44 %
Platelets: 251 10*3/uL (ref 150–400)
RBC: 4.83 MIL/uL (ref 3.87–5.11)
RDW: 13.1 % (ref 11.5–15.5)
WBC: 6.5 10*3/uL (ref 4.0–10.5)
nRBC: 0 % (ref 0.0–0.2)

## 2020-12-08 LAB — POC SARS CORONAVIRUS 2 AG: SARSCOV2ONAVIRUS 2 AG: NEGATIVE

## 2020-12-08 LAB — HEMOGLOBIN A1C
Hgb A1c MFr Bld: 6 % — ABNORMAL HIGH (ref 4.8–5.6)
Mean Plasma Glucose: 125.5 mg/dL

## 2020-12-08 LAB — RESP PANEL BY RT-PCR (FLU A&B, COVID) ARPGX2
Influenza A by PCR: NEGATIVE
Influenza B by PCR: NEGATIVE
SARS Coronavirus 2 by RT PCR: NEGATIVE

## 2020-12-08 LAB — ETHANOL: Alcohol, Ethyl (B): 127 mg/dL — ABNORMAL HIGH (ref <10)

## 2020-12-08 LAB — POCT PREGNANCY, URINE: Preg Test, Ur: NEGATIVE

## 2020-12-08 LAB — TSH: TSH: 1.182 u[IU]/mL (ref 0.350–4.500)

## 2020-12-08 MED ORDER — THIAMINE HCL 100 MG PO TABS: 100.0000 mg | ORAL_TABLET | Freq: Every day | ORAL | 0 refills | Status: DC

## 2020-12-08 MED ORDER — DIVALPROEX SODIUM ER 250 MG PO TB24: 250.0000 mg | ORAL_TABLET | Freq: Every day | ORAL | 0 refills | Status: DC

## 2020-12-08 MED ORDER — OLANZAPINE 5 MG PO TABS: 5.0000 mg | ORAL_TABLET | Freq: Every day | ORAL | 0 refills | Status: DC

## 2020-12-08 MED ORDER — ADULT MULTIVITAMIN W/MINERALS CH: 1.0000 | ORAL_TABLET | Freq: Every day | ORAL | Status: DC

## 2020-12-08 MED ORDER — HYDROXYZINE HCL 25 MG PO TABS: 25.0000 mg | ORAL_TABLET | Freq: Three times a day (TID) | ORAL | 0 refills | Status: DC | PRN

## 2020-12-08 MED ORDER — CLONIDINE HCL 0.1 MG PO TABS
0.1000 mg | ORAL_TABLET | Freq: Once | ORAL | Status: AC
  Administered 2020-12-08: 0.1 mg via ORAL
  Filled 2020-12-08: qty 1

## 2020-12-08 MED ORDER — DIVALPROEX SODIUM ER 250 MG PO TB24
250.0000 mg | ORAL_TABLET | Freq: Every day | ORAL | 0 refills | Status: DC
Start: 2020-12-08 — End: 2020-12-14

## 2020-12-08 NOTE — ED Notes (Signed)
Pt just got up and went to restroom. Pt had c/o of stomach pains 650mg  tylenol was given. Pt is laying down in bed. Will continue to monitor pt pain

## 2020-12-08 NOTE — Progress Notes (Signed)
Patient resting eyes closed, and respirations unlabored at this time. No objective signs of discomfort.

## 2020-12-08 NOTE — ED Provider Notes (Addendum)
[facility]BC/OBS ASAP Discharge Summary  Date and Time: 12/08/2020 3:10 PM  Name: Sylvia Arellano  MRN:  [id number]   Discharge Diagnoses:  Final diagnoses:  Bipolar 1 disorder (HCC)  Alcohol abuse, daily use  Bipolar 2 disorder (HCC)    Subjective: Pt is seen and examined today.  Pt states her mood is low . Pt rates her mood at 3/10 (10 is the best mood). Pt slept well last night and feels sleepy and was feeling sleepy in morning.  Pt states her appetite is good. Pt denies any anxiety. Currently, Pt denies any suicidal ideation, homicidal ideation and, visual and auditory hallucination. She states she was hearing voices yesterday that her that she is worthless. Pt states she needs medication for depression. Patient declined inpatient substance abuse treatment at this time.  States she just need to get back on her medication.  She may be interested in outpatient substance abuse program later. Pt denies any headache, nausea, vomiting, dizziness, chest pain, SOB, abdominal pain, diarrhea, and constipation. Pt denies any medication side effects and has been tolerating it well. Pt denies any concerns. Pt states she needs a bus pass and she can go to friends place.   Stay Summary: Sylvia Arellano is a 50 year old female with past psychiatric history of bipolar 1 disorder, alcohol abuse, and cocaine abuse, as well as history of seizures, who [facility] as a voluntary walk-in to worsening depression. Pt was [facility] to [facility]. Pt was restarted on home meds. Pt was reevaluated this morning. Today, she reported low mood but improved anxiety. She  denied SI, HI and AVH. Denied any side effects from medications. CSW offered her help with substance abuse treatment for inpatient/outpatient.  Patient declined inpatient substance abuse treatment at this time.  States she just need to get back on her medication.  She may be interested in outpatient substance abuse program later.  Patient was given  information and resources about outpatient substance abuse treatment programs.  Patient is discharged to home/self-care.  But ticket was given.  Total Time spent with patient: 20 minutes  Past Psychiatric History: bipolar 1 disorder, alcohol abuse, and cocaine abuse Past Medical History:  Past Medical History:  Diagnosis Date   Bipolar 2 disorder (HCC)    Seizures (HCC)    Suicide attempt (HCC)    History reviewed. No pertinent surgical history. Family History: History reviewed. No pertinent family history. Family Psychiatric History: see H&P Social History:  Social History   Substance and Sexual Activity  Alcohol Use Yes   Comment: "twice a week"     Social History   Substance and Sexual Activity  Drug Use Yes   Types: Cocaine    Social History   Socioeconomic History   Marital status: Single    Spouse name: Not on file   Number of children: Not on file   Years of education: Not on file   Highest education level: Not on file  Occupational History   Not on file  Tobacco Use   Smoking status: Every Day    Packs/day: 0.50    Types: Cigarettes   Smokeless tobacco: Never  Vaping Use   Vaping Use: Never used  Substance and Sexual Activity   Alcohol use: Yes    Comment: "twice a week"   Drug use: Yes    Types: Cocaine   Sexual activity: Yes    Birth control/protection: Condom  Other Topics Concern   Not on file  Social History Narrative   Not on file   Social Determinants of Health   Financial Resource Strain: Not on file  Food Insecurity: Not on file  Transportation Needs: Not on file  Physical Activity: Not on file  Stress: Not on file  Social Connections: Not on file   SDOH:  SDOH Screenings   Alcohol Screen: Low Risk    Last Alcohol Screening Score (AUDIT): 5  Depression (PHQ2-9): Medium Risk   PHQ-2 Score: 24  Financial Resource Strain: Not on file  Food Insecurity: Not on file  Housing: Not on file  Physical Activity: Not on file  Social  Connections: Not on file  Stress: Not on file  Tobacco Use: High Risk   Smoking Tobacco Use: Every Day   Smokeless Tobacco Use: Never  Transportation Needs: Not on file    Tobacco Cessation:  A prescription for an FDA-approved tobacco cessation medication was offered at discharge and the patient refused  Current Medications:  Current Facility-Administered Medications  Medication Dose Route Frequency Provider Last Rate Last Admin   acetaminophen (TYLENOL) tablet 650 mg  650 mg Oral Q6H PRN Jaclyn Shaggy, PA-C   650 mg at 12/08/20 0310   alum & mag hydroxide-simeth (MAALOX/MYLANTA) 200-200-20 MG/5ML suspension 30 mL  30 mL Oral Q4H PRN Melbourne Abts W, PA-C       divalproex (DEPAKOTE ER) 24 hr tablet 250 mg  250 mg Oral QHS Ladona Ridgel, Cody W, PA-C   250 mg at 12/08/20 0008   hydrOXYzine (ATARAX/VISTARIL) tablet 25 mg  25 mg Oral TID PRN Jaclyn Shaggy, PA-C   25 mg at 12/08/20 0008   loperamide (IMODIUM) capsule 2-4 mg  2-4 mg Oral PRN Melbourne Abts W, PA-C       LORazepam (ATIVAN) tablet 1 mg  1 mg Oral Q6H PRN Melbourne Abts W, PA-C       magnesium hydroxide (MILK OF MAGNESIA) suspension 30 mL  30 mL Oral Daily PRN Melbourne Abts W, PA-C       multivitamin with minerals tablet 1 tablet  1 tablet Oral Daily Melbourne Abts W, PA-C   1 tablet at 12/08/20 1039   OLANZapine (ZYPREXA) tablet 5 mg  5 mg Oral QHS Melbourne Abts W, PA-C   5 mg at 12/08/20 0008   ondansetron (ZOFRAN-ODT) disintegrating tablet 4 mg  4 mg Oral Q6H PRN Melbourne Abts W, PA-C       thiamine tablet 100 mg  100 mg Oral Daily Melbourne Abts W, PA-C   100 mg at 12/08/20 1039   traZODone (DESYREL) tablet 50 mg  50 mg Oral QHS PRN Jaclyn Shaggy, PA-C   50 mg at 12/08/20 0008   Current Outpatient Medications  Medication Sig Dispense Refill   divalproex (DEPAKOTE ER) 250 MG 24 hr tablet Take 1 tablet (250 mg total) by mouth at bedtime. 30 tablet 0   hydrOXYzine (ATARAX/VISTARIL) 25 MG tablet Take 1 tablet (25 mg total) by mouth 3 (three)  times daily as needed for anxiety. 90 tablet 0   [START ON 12/09/2020] Multiple Vitamin (MULTIVITAMIN WITH MINERALS) TABS tablet Take 1 tablet by mouth daily.     OLANZapine (ZYPREXA) 5 MG tablet Take 1 tablet (5 mg total) by mouth at bedtime. 30 tablet 0   [START ON 12/09/2020] thiamine 100 MG tablet Take 1 tablet (100 mg total) by mouth daily. 30 tablet 0    PTA Medications: (Not in a hospital admission)   Musculoskeletal  Strength & Muscle Tone:  within normal limits Gait & Station: normal Patient leans: N/A  Psychiatric Specialty Exam  Presentation  General Appearance: Appropriate for Environment  Eye Contact:Good  Speech:Normal Rate  Speech Volume:Normal  Handedness: No data recorded  Mood and Affect  Mood:Dysphoric  Affect:Congruent   Thought Process  Thought Processes:Coherent  Descriptions of Associations:Intact  Orientation:Full (Time, Place and Person)  Thought Content:WDL  Diagnosis of Schizophrenia or Schizoaffective disorder in past: No    Hallucinations:Hallucinations: None Description of Auditory Hallucinations: None at this time.  Ideas of Reference:None  Suicidal Thoughts:Suicidal Thoughts: No SI Passive Intent and/or Plan: Without Intent; Without Plan  Homicidal Thoughts:Homicidal Thoughts: No   Sensorium  Memory:Immediate Fair; Recent Fair; Remote Fair  Judgment:Poor  Insight:Fair   Executive Functions  Concentration:Fair  Attention Span:Fair  Recall:Fair  Fund of Knowledge:Fair  Language:Fair   Psychomotor Activity  Psychomotor Activity:Psychomotor Activity: Normal   Assets  Assets:Communication Skills; Desire for Improvement; Physical Health; Resilience; Vocational/Educational   Sleep  Sleep:Sleep: Fair Number of Hours of Sleep: 1   Nutritional Assessment (For OBS and [facility] admissions only) Has the patient had a weight loss or gain of 10 pounds or more in the last 3 months?: No Has the patient had a decrease in  food intake/or appetite?: Yes Does the patient have dental problems?: No Does the patient have eating habits or behaviors that may be indicators of an eating disorder including binging or inducing vomiting?: No Has the patient recently lost weight without trying?: No Has the patient been eating poorly because of a decreased appetite?: Yes Malnutrition Screening Tool Score: 1   Physical Exam  Physical Exam Vitals and nursing note reviewed.  Constitutional:      General: She is not in acute distress.    Appearance: Normal appearance. She is not ill-appearing, toxic-appearing or diaphoretic.  HENT:     Head: Normocephalic and atraumatic.  Pulmonary:     Effort: Pulmonary effort is normal.  Neurological:     General: No focal deficit present.     Mental Status: She is alert and oriented to person, place, and time.   Review of Systems  Constitutional:  Negative for chills and fever.  HENT:  Negative for hearing loss.   Eyes:  Negative for blurred vision.  Respiratory:  Negative for shortness of breath.   Cardiovascular:  Negative for chest pain.  Gastrointestinal:  Negative for abdominal pain, nausea and vomiting.  Neurological:  Negative for dizziness, tremors and headaches.  Psychiatric/Behavioral:  Positive for depression, hallucinations and substance abuse. Negative for memory loss and suicidal ideas. The patient has insomnia. The patient is not nervous/anxious.   Blood pressure 100/80, pulse 78, temperature 98 F (36.7 C), temperature source Oral, resp. rate 18, SpO2 98 %. There is no height or weight on file to calculate BMI.  Demographic Factors:  Low socioeconomic status and Living alone  Loss Factors: Loss of significant relationship and Financial problems/change in socioeconomic status  Historical Factors: Prior suicide attempts and Anniversary of important loss  Risk Reduction Factors:   Employed  Continued Clinical Symptoms:  Bipolar Disorder:   Depressive  phase  Cognitive Features That Contribute To Risk:  None    Suicide Risk:  Mild:  Suicidal ideation of limited frequency, intensity, duration, and specificity.  There are no identifiable plans, no associated intent, mild dysphoria and related symptoms, good self-control (both objective and subjective assessment), few other risk factors, and identifiable protective factors, including available and accessible social support.  Plan Of Care/Follow-up recommendations:  Activity:  As Tolerated -labs reviewed.  CBC, CMP within normal limits, HbA1c 6, TSH 1.182 lipid panel shows triglyceride 169, cholesterol 251, LDL 122.  Acetaminophen level less than 10, salicylate level <7, UDS positive for cocaine, Ethyl alcohol level 127. -Patient does not meet criteria for inpatient hospitalization.  Patient is discharged to home/self-care -Patient given resources for outpatient substance abuse rehab program. -Follow-up  information given for medication management at [facility] walk-in. -Recommended to continue Zyprexa, Depakote, hydroxyzine.  Paper prescriptions given. -[employer] given.  Disposition: Pt doesn't meet criteria for inpatient Hospitalization.  Patient is discharged.  -For medication management patient can follow-up at [facility] walk-in clinic. -Patient given resources for outpatient substance abuse rehab program.  Karsten Ro, MD 12/08/2020, 3:10 PM

## 2020-12-08 NOTE — Discharge Instructions (Addendum)
Prescriptions given at discharge. Patient agreeable to plan.  Given opportunity to ask questions.  Appears to feel comfortable with discharge denies any current suicidal or homicidal thought. Patient is also instructed prior to discharge to: Take all medications as prescribed by her mental healthcare provider. Report any adverse effects and or reactions from the medicines to her outpatient provider promptly. Patient has been instructed & cautioned: To not engage in alcohol and or illegal drug use while on prescription medicines. In the event of worsening symptoms, patient is instructed to call the crisis hotline, 911 and or go to the nearest ED for appropriate evaluation and treatment of symptoms. To follow-up with her primary care provider for your other medical issues, concerns and or health care needs.     Please come to [facility] (this facility) during walk in hours for appointment with psychiatrist for further medication management and for therapists for therapy.    Walk in hours are 8-11 AM Monday through Thursday for medication management.Child and adolescent psychiatrists are only available on Wednesdays and Thursdays during walk in hours.  Therapy walk in hours are Monday-Wednesday 8 AM-1PM.   It is first come, first -serve; it is best to arrive by 7:00 AM.   On Friday from 1 pm to 4 pm for therapy intake only. Please arrive by 12:00 pm as it is  first come, first -serve.    When you arrive please go upstairs for your appointment. If you are unsure of where to go, inform the front desk that you are here for a walk in appointment and they will assist you with directions upstairs.  Address:  [address] in [place], [address] Ph: [phone]

## 2020-12-08 NOTE — Progress Notes (Signed)
CSW spoke with the patient at the request of Dr. Leone Haven.  Physician stated that patient may be interested in resources for [facility] for homelessness and substance use.   CSW noted the patient to be somewhat out of it and struggling with her thoughts, perhaps due to sleepiness as pt appeared to doze off and kept eyes closed during discussion, speech was somewhat slurred too.  When asked pointed questions patient was able to answer appropriately and clearly.  Patient reports that she would like to stay in [place] and have resources for the area here.  She reports that she works at [employer] and would like to keep her job.  CSW provided her the number so that pt can contact them later.    CSW spoke with patient about inpatient or outpatient treatment and pt reports that she doesn't know which she would like.  CSW informed that she would give the patient some time to think of which she wanted and would follow up.  Penni Homans, MSW, LCSW 12/08/2020 10:35 AM

## 2020-12-08 NOTE — ED Notes (Signed)
Pt A&O x4, pt is homeless, presents with stress and anxiety.  Denies SI, HI, refuses to answer for AVH she reports.  Feeling hopeless, pt admits to cocaine abuse also.  Skin search completed, monitoring for safety.  Comfort measures given.

## 2020-12-08 NOTE — BH Assessment (Signed)
Comprehensive Clinical Assessment (CCA) Note  12/08/2020 Sylvia Arellano [id number]  Discharge Disposition: Sylvia John, PA-C, reviewed pt's chart and information and met with pt and determined pt should be observed overnight at the [facility] for stability and re-assessed in the morning by psychiatry.  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 Acute risk factors for suicide include:  pt is currently off of her medication . Protective factors for this patient include: hope for the future. Considering these factors, the overall suicide risk at this point appears to be none. Patient is not appropriate for outpatient follow up.  Therefore, no sitter is recommended for suicide precautions.  [place] ED from 12/07/2020 in [facility] Most recent reading at 12/08/2020 12:32 AM ED from 05/26/2020 in [place] DEPT Most recent reading at 05/26/2020 11:33 PM ED from 05/26/2020 in [facility] Most recent reading at 05/26/2020 10:46 AM  C-SSRS RISK CATEGORY No Risk Error: Q7 should not be populated when Q6 is No No Risk     Chief Complaint:  Chief Complaint  Patient presents with   Anxiety   Stress   Visit Diagnosis: F14.24, Cocaine-induced depressive disorder, With moderate or severe use disorder  CCA Screening, Triage and Referral (STR) Sylvia Arellano is a 50 year old patient who came to the [facility] on her own accord. Pt states she came to the [facility] because, "I have been taking different types of medications for years. I'm having an episode. I've been off of my medication for 5-6 months." Pt is tearful at times and has difficulties answering questions in triage and has flight of ideas.  Pt denies SI, though  she acknowledges she's attempted to kill herself in the past (she can't remember when). She shares she's been hospitalized in the past but is also unable to provide information regarding these admissions. Pt denies she currently has a plan to kill herself.  Pt denies HI, AVH, NSSIB, access to guns/weapons, or engagement with the legal system. Pt shares she drinks 3 12-ounce beers 3x/week and that she last drank today. Pt denies any other substance use, though her UDA returned positive for cocaine.  Pt's orientation was [facility]. Her recent/remote memory appears to be intact, though it was difficult to determine at times due to an inability to understanding what pt was saying. Pt's insight, judgement, and impulse control is fair at this time.   Patient Reported Information How did you hear about [place]? Self  What Is the Reason for Your Visit/Call Today? Pt states, "I have been taking different types of medications for years. I'm having an episode. I've been off my medications for 5-6 months."  How Long Has This Been Causing You Problems? 1 wk - 1 month  What Do You Feel Would Help You the Most Today? Treatment for Depression or other mood problem; Medication(s)   Have You Recently Had Any Thoughts About Hurting Yourself? No  Are You Planning to Commit Suicide/Harm Yourself At This time? No   Have you Recently Had Thoughts About [place]? No  Are You Planning to Harm Someone at This Time? No  Explanation: No data recorded  Have You Used Any Alcohol or Drugs in the Past 24 Hours? Yes  How Long Ago Did You Use Drugs or Alcohol? No data recorded What Did You  Use and How Much? Pt states she drank EtOH throughout the day today.   Do You Currently Have a Therapist/Psychiatrist? No  Name of Therapist/Psychiatrist: Na ([employer] 05/26/2020)   Have You Been Recently Discharged From Any Office Practice or Programs? No  Explanation of Discharge From Practice/Program: No data  recorded    CCA Screening Triage Referral Assessment Type of Contact: Face-to-Face  Telemedicine Service Delivery:   Is this Initial or Reassessment? No data recorded Date Telepsych consult ordered in [facility]:  No data recorded Time Telepsych consult ordered in [facility]:  No data recorded Location of Assessment: [facility] Assessment Services  Provider Location: [facility] Assessment Services   Collateral Involvement: Pt declined to provide verbal consent for clinician to make contact with friends/family for collateral.   Does Patient Have a [place]? No data recorded Name and Contact of Legal Guardian: No data recorded If Minor and Not Living with Parent(s), Who has Custody? N/A  Is CPS involved or ever been involved? Never  Is APS involved or ever been involved? Never   Patient Determined To Be At Risk for Harm To Self or Others Based on Review of Patient Reported Information or Presenting Complaint? Yes, for Self-Harm  Method: No data recorded Availability of Means: No data recorded Intent: No data recorded Notification Required: No data recorded Additional Information for Danger to Others Potential: No data recorded Additional Comments for Danger to Others Potential: No data recorded Are There Guns or Other Weapons in Your Home? No data recorded Types of Guns/Weapons: No data recorded Are These Weapons Safely Secured?                            No data recorded Who Could Verify You Are Able To Have These Secured: No data recorded Do You Have any Outstanding Charges, Pending Court Dates, Parole/Probation? No data recorded Contacted To Inform of Risk of Harm To Self or Others: -- (Pt declined to provide verbal consent for friends/family to be contacted)    Does Patient Present under Involuntary Commitment? No  IVC Papers Initial File Date: No data recorded  [place] of Residence: [place]   Patient Currently Receiving the Following Services: Not Receiving  Services   Determination of Need: Urgent (48 hours)   Options For Referral: [facility]; Medication Management; Outpatient Therapy     CCA Biopsychosocial Patient Reported Schizophrenia/Schizoaffective Diagnosis in Past: No   Strengths: Pt shares she was on medication for her mental health concerns and did well. She shares she was back working but that, with her current mental health symptoms, she is afraid she will lose her job.   Mental Health Symptoms Depression:   Tearfulness; Difficulty Concentrating; Hopelessness; Increase/decrease in appetite; Change in energy/activity; Worthlessness   Duration of Depressive symptoms:  Duration of Depressive Symptoms: Greater than two weeks   Mania:   Racing thoughts   Anxiety:    Worrying; Tension   Psychosis:   None   Duration of Psychotic symptoms:  Duration of Psychotic Symptoms: N/A   Trauma:   N/A   Obsessions:   N/A   Compulsions:   N/A   Inattention:   N/A   Hyperactivity/Impulsivity:   N/A   Oppositional/Defiant Behaviors:   N/A   Emotional Irregularity:   Mood lability   Other Mood/Personality Symptoms:   None noted    Mental Status Exam Appearance and self-care  Stature:   Average   Weight:  Average weight   Clothing:   Neat/clean   Grooming:   Normal   Cosmetic use:   Age appropriate   Posture/gait:   Normal   Motor activity:   Not Remarkable   Sensorium  Attention:   Distractible   Concentration:   Normal   Orientation:   X5   Recall/memory:   Normal   Affect and Mood  Affect:   Tearful; Anxious; Depressed   Mood:   Anxious; Depressed   Relating  Eye contact:   Normal   Facial expression:   Responsive   Attitude toward examiner:   Guarded   Thought and Language  Speech flow:  Pressured; Slurred; Flight of Ideas   Thought content:   Appropriate to Mood and Circumstances   Preoccupation:   None   Hallucinations:   None   Organization:   No data recorded  [employer] of Knowledge:   Fair; Average   Intelligence:   Average   Abstraction:   Normal   Judgement:   Fair   Reality Testing:   Adequate   Insight:   Fair   Decision Making:   Impulsive   Social Functioning  Social Maturity:   Isolates   Social Judgement:   Heedless   Stress  Stressors:   Museum/gallery curator; Work   Coping Ability:   Overwhelmed; Exhausted   Skill Deficits:   Decision making; Self-control   Supports:   Family     Religion: Religion/Spirituality Are You A Religious Person?:  (Not assessed) How Might This Affect Treatment?: Not assessed  Leisure/Recreation: Leisure / Recreation Do You Have Hobbies?: Yes Leisure and Hobbies: Pt states she watches television  Exercise/Diet: Exercise/Diet Do You Exercise?:  (Not assessed) Have You Gained or Lost A Significant Amount of Weight in the Past Six Months?:  (Not assessed) Do You Follow a Special Diet?:  (Not assessed) Do You Have Any Trouble Sleeping?:  (Not assessed) Explanation of Sleeping Difficulties: Not assessed   CCA Employment/Education Employment/Work Situation: Employment / Work Situation Employment Situation: Employed Work Stressors: Pt shares she is having difficulties paying to get to and from work due to lack of transportation. She states that this "episode" is making it difficult for her to work. Patient's Job has Been Impacted by Current Illness: Yes Describe how Patient's Job has Been Impacted: Pt shares she is having difficulties paying to get to and from work due to lack of transportation. She states that this "episode" is making it difficult for her to work. Has Patient ever Been in the [employer]?: No  Education: Education Is Patient Currently Attending School?: No Last Grade Completed:  (Not assessed) Did You Attend College?:  (Not assessed) Did You Have An Individualized Education Program (IIEP):  (Not assessed) Did You Have Any  Difficulty At School?:  (Not assessed) Patient's Education Has Been Impacted by Current Illness:  (Not assessed)   CCA Family/Childhood History Family and Relationship History: Family history Marital status:  (Not assessed) Does patient have children?: Yes How many children?: 2 How is patient's relationship with their children?: Pt shares she has a good relationship with her children.  Childhood History:  Childhood History By whom was/is the patient raised?: Mother Did patient suffer any verbal/emotional/physical/sexual abuse as a child?: No Did patient suffer from severe childhood neglect?: No Has patient ever been sexually abused/assaulted/raped as an adolescent or adult?: No Was the patient ever a victim of a crime or a disaster?: No Witnessed domestic violence?: No Has patient been affected  by domestic violence as an adult?: No  Child/Adolescent Assessment:     CCA Substance Use Alcohol/Drug Use: Alcohol / Drug Use Pain Medications: See MAR Prescriptions: See MAR Over the Counter: See MAR History of alcohol / drug use?: Yes Longest period of sobriety (when/how long): Unknown Negative Consequences of Use:  (Pt denies) Withdrawal Symptoms:  (Pt denies) Substance #1 Name of Substance 1: Cocaine 1 - Age of First Use: Unknown 1 - Amount (size/oz): Unknown 1 - Frequency: Unknown 1 - Duration: Unknown 1 - Last Use / Amount: Pt's UDA on 12/07/2020 was positive for cocaine; pt denied use of any substance other than EtOH. 1 - Method of Aquiring: Unknown 1- Route of Use: Unknown Substance #2 Name of Substance 2: EtOH 2 - Age of First Use: Unknown 2 - Amount (size/oz): 3 12-ounce beers 2 - Frequency: 3x/week 2 - Duration: Unknown 2 - Last Use / Amount: 12/07/2020 (today) 2 - Method of Aquiring: Purchase 2 - Route of Substance Use: Oral (drank)                     ASAM's:  Six Dimensions of Multidimensional Assessment  Dimension 1:  Acute Intoxication and/or  Withdrawal Potential:      Dimension 2:  Biomedical Conditions and Complications:      Dimension 3:  Emotional, Behavioral, or Cognitive Conditions and Complications:     Dimension 4:  Readiness to Change:     Dimension 5:  Relapse, Continued use, or Continued Problem Potential:     Dimension 6:  Recovery/Living Environment:     ASAM Severity Score:    ASAM Recommended Level of Treatment: ASAM Recommended Level of Treatment:  (UTA - pt shares she has no difficulties due to her EtOH use and did not share her active use of cocaine when asked)   Substance use Disorder (SUD) Substance Use Disorder (SUD)  Checklist Symptoms of Substance Use:  (UTA - pt shares she has no difficulties due to her EtOH use and did not share her active use of cocaine when asked)  Recommendations for Services/Supports/Treatments: Recommendations for Services/Supports/Treatments Recommendations For Services/Supports/Treatments: Other (Comment), Individual Therapy, Medication Management (Overnight observation)  Discharge Disposition: Sylvia John, PA-C, reviewed pt's chart and information and met with pt and determined pt should be observed overnight at the [facility] for stability and re-assessed in the morning by psychiatry.  DSM5 Diagnoses: Patient Active Problem List   Diagnosis Date Noted   Bipolar 1 disorder ([place]) 05/26/2020   Bipolar I disorder ([place])    Bipolar disord, crnt episode manic w/o psych features, mod ([place]) 01/17/2018   Generalized anxiety disorder 01/17/2018   Reaction to severe stress, unspecified 01/17/2018   Personality disorder, unspecified ([place]) 01/17/2018   Cocaine dependence, uncomplicated ([place]) 35/70/1779   Cocaine use    Encephalopathy, hypertensive    TIA (transient ischemic attack) 07/09/2014   Hypertensive urgency 07/09/2014   Cocaine abuse ([place]) 07/09/2014   Alcohol abuse, daily use 07/09/2014   Tobacco abuse 07/09/2014   History of seizure 07/09/2014   Bipolar 2 disorder ([place])  07/09/2014   Hypokalemia 07/09/2014   Cerebral infarction due to unspecified mechanism      Referrals to Alternative Service(s): Referred to Alternative Service(s):   Place:   Date:   Time:    Referred to Alternative Service(s):   Place:   Date:   Time:    Referred to Alternative Service(s):   Place:   Date:   Time:    Referred to  Alternative Service(s):   Place:   Date:   Time:     Dannielle Burn, LMFT

## 2020-12-08 NOTE — Progress Notes (Signed)
CSW met with the patient again.  Patient reports that she doesn't want to go to inpatient treatment at this time, stating that she can take the time to do so.  Pt was open to outpatient resources.  Patient reports that she would like [facility] resources.    CSW will add outpatient resources to the patient's AVS.  Assunta Curtis, MSW, LCSW 12/08/2020 2:30 PM

## 2020-12-08 NOTE — Progress Notes (Signed)
CSW met with the patient again to go over possible aftercare solutions of inpatient or outpatient treatment.  Patient reports that she has NOT reviewed the paperwork.  She reports that she does NOT need CSW assistance in going through it.    She asked CSW to return in 30 minutes.  Assunta Curtis, MSW, LCSW 12/08/2020 1:20 PM

## 2020-12-08 NOTE — ED Notes (Signed)
Meal given

## 2020-12-08 NOTE — Progress Notes (Signed)
CSW followed up with patient on plans for aftercare. Patient reports that she is "too sleepy" and has not thought more about it.  She reports that if she chooses outpatient she will stay with a peer.  She reports that she knows the difference between outpatient and inpatient.  She asked for a print out of resources to review.  CSW provided the patient with a copy.  Penni Homans, MSW, LCSW 12/08/2020 11:39 AM

## 2020-12-08 NOTE — ED Notes (Signed)
Patient received Sample medications and follow up community resources with paper medication prescriptions. Medication education provided. Patient received all belongings from [facility] locker, and RN printed return to work note. Patient denies SI/ HI and AVH at time of discharge.

## 2020-12-13 ENCOUNTER — Ambulatory Visit (HOSPITAL_COMMUNITY)
Admission: EM | Admit: 2020-12-13 | Discharge: 2020-12-14 | Disposition: A | Discharge status: Home / Self Care | Payer: No Payment, Other | Attending: Student | Admitting: Student

## 2020-12-13 DIAGNOSIS — F17210 Nicotine dependence, cigarettes, uncomplicated: Secondary | ICD-10-CM | POA: Insufficient documentation

## 2020-12-13 DIAGNOSIS — Z5900 Homelessness unspecified: Secondary | ICD-10-CM | POA: Insufficient documentation

## 2020-12-13 DIAGNOSIS — F1010 Alcohol abuse, uncomplicated: Secondary | ICD-10-CM | POA: Insufficient documentation

## 2020-12-13 DIAGNOSIS — F319 Bipolar disorder, unspecified: Secondary | ICD-10-CM | POA: Insufficient documentation

## 2020-12-13 DIAGNOSIS — Z20822 Contact with and (suspected) exposure to covid-19: Secondary | ICD-10-CM | POA: Insufficient documentation

## 2020-12-13 DIAGNOSIS — Z9151 Personal history of suicidal behavior: Secondary | ICD-10-CM | POA: Insufficient documentation

## 2020-12-13 DIAGNOSIS — F1490 Cocaine use, unspecified, uncomplicated: Secondary | ICD-10-CM | POA: Insufficient documentation

## 2020-12-13 DIAGNOSIS — Z8659 Personal history of other mental and behavioral disorders: Secondary | ICD-10-CM | POA: Insufficient documentation

## 2020-12-13 LAB — CBC WITH DIFFERENTIAL/PLATELET
Abs Immature Granulocytes: 0.01 10*3/uL (ref 0.00–0.07)
Basophils Absolute: 0 10*3/uL (ref 0.0–0.1)
Basophils Relative: 1 %
Eosinophils Absolute: 0.1 10*3/uL (ref 0.0–0.5)
Eosinophils Relative: 2 %
HCT: 41.4 % (ref 36.0–46.0)
Hemoglobin: 13.8 g/dL (ref 12.0–15.0)
Immature Granulocytes: 0 %
Lymphocytes Relative: 62 %
Lymphs Abs: 3.7 10*3/uL (ref 0.7–4.0)
MCH: 28.9 pg (ref 26.0–34.0)
MCHC: 33.3 g/dL (ref 30.0–36.0)
MCV: 86.8 fL (ref 80.0–100.0)
Monocytes Absolute: 0.4 10*3/uL (ref 0.1–1.0)
Monocytes Relative: 6 %
Neutro Abs: 1.8 10*3/uL (ref 1.7–7.7)
Neutrophils Relative %: 29 %
Platelets: 269 10*3/uL (ref 150–400)
RBC: 4.77 MIL/uL (ref 3.87–5.11)
RDW: 13.2 % (ref 11.5–15.5)
WBC: 6.1 10*3/uL (ref 4.0–10.5)
nRBC: 0 % (ref 0.0–0.2)

## 2020-12-13 LAB — COMPREHENSIVE METABOLIC PANEL
ALT: 28 U/L (ref 0–44)
AST: 37 U/L (ref 15–41)
Albumin: 4.3 g/dL (ref 3.5–5.0)
Alkaline Phosphatase: 72 U/L (ref 38–126)
Anion gap: 14 (ref 5–15)
BUN: 18 mg/dL (ref 6–20)
CO2: 22 mmol/L (ref 22–32)
Calcium: 9.8 mg/dL (ref 8.9–10.3)
Chloride: 107 mmol/L (ref 98–111)
Creatinine, Ser: 0.71 mg/dL (ref 0.44–1.00)
GFR, Estimated: >60 mL/min (ref >60)
Glucose, Bld: 92 mg/dL (ref 70–99)
Potassium: 3.7 mmol/L (ref 3.5–5.1)
Sodium: 143 mmol/L (ref 135–145)
Total Bilirubin: 0.5 mg/dL (ref 0.3–1.2)
Total Protein: 8.2 g/dL — ABNORMAL HIGH (ref 6.5–8.1)

## 2020-12-13 LAB — POCT URINE DRUG SCREEN - MANUAL ENTRY (I-SCREEN)
POC Amphetamine UR: NOT DETECTED
POC Buprenorphine (BUP): NOT DETECTED
POC Cocaine UR: POSITIVE — AB
POC Marijuana UR: NOT DETECTED
POC Methadone UR: NOT DETECTED
POC Methamphetamine UR: NOT DETECTED
POC Morphine: NOT DETECTED
POC Oxazepam (BZO): NOT DETECTED
POC Oxycodone UR: NOT DETECTED
POC Secobarbital (BAR): NOT DETECTED

## 2020-12-13 LAB — RESP PANEL BY RT-PCR (FLU A&B, COVID) ARPGX2
Influenza A by PCR: NEGATIVE
Influenza B by PCR: NEGATIVE
SARS Coronavirus 2 by RT PCR: NEGATIVE

## 2020-12-13 LAB — ETHANOL: Alcohol, Ethyl (B): 119 mg/dL — ABNORMAL HIGH (ref <10)

## 2020-12-13 LAB — POC SARS CORONAVIRUS 2 AG -  ED: SARS Coronavirus 2 Ag: NEGATIVE

## 2020-12-13 LAB — POCT PREGNANCY, URINE: Preg Test, Ur: NEGATIVE

## 2020-12-13 MED ORDER — ADULT MULTIVITAMIN W/MINERALS CH
1.0000 | ORAL_TABLET | Freq: Every day | ORAL | Status: DC
  Administered 2020-12-14: 1 via ORAL
  Filled 2020-12-13: qty 1

## 2020-12-13 MED ORDER — HYDROXYZINE HCL 25 MG PO TABS
25.0000 mg | ORAL_TABLET | Freq: Three times a day (TID) | ORAL | Status: DC | PRN
  Administered 2020-12-13 (×2): 25 mg via ORAL
  Filled 2020-12-13 (×2): qty 1

## 2020-12-13 MED ORDER — ACETAMINOPHEN 325 MG PO TABS
650.0000 mg | ORAL_TABLET | Freq: Four times a day (QID) | ORAL | Status: DC | PRN
  Administered 2020-12-13: 650 mg via ORAL
  Filled 2020-12-13: qty 2

## 2020-12-13 MED ORDER — LORAZEPAM 1 MG PO TABS: 1.0000 mg | ORAL_TABLET | Freq: Four times a day (QID) | ORAL | Status: DC | PRN

## 2020-12-13 MED ORDER — CHLORDIAZEPOXIDE HCL 25 MG PO CAPS
25.0000 mg | ORAL_CAPSULE | Freq: Three times a day (TID) | ORAL | Status: DC
  Administered 2020-12-14: 25 mg via ORAL
  Filled 2020-12-13: qty 1

## 2020-12-13 MED ORDER — THIAMINE HCL 100 MG PO TABS
100.0000 mg | ORAL_TABLET | Freq: Every day | ORAL | Status: DC
  Administered 2020-12-13: 100 mg via ORAL
  Filled 2020-12-13: qty 1

## 2020-12-13 MED ORDER — CHLORDIAZEPOXIDE HCL 25 MG PO CAPS
25.0000 mg | ORAL_CAPSULE | Freq: Four times a day (QID) | ORAL | Status: AC
  Administered 2020-12-13 (×4): 25 mg via ORAL
  Filled 2020-12-13 (×4): qty 1

## 2020-12-13 MED ORDER — GABAPENTIN 100 MG PO CAPS
100.0000 mg | ORAL_CAPSULE | Freq: Three times a day (TID) | ORAL | Status: DC
  Administered 2020-12-13 – 2020-12-14 (×4): 100 mg via ORAL
  Filled 2020-12-13 (×4): qty 1

## 2020-12-13 MED ORDER — HYDROCERIN EX CREA
TOPICAL_CREAM | Freq: Two times a day (BID) | CUTANEOUS | Status: DC
  Filled 2020-12-13: qty 113

## 2020-12-13 MED ORDER — MAGNESIUM HYDROXIDE 400 MG/5ML PO SUSP: 30.0000 mL | Freq: Every day | ORAL | Status: DC | PRN

## 2020-12-13 MED ORDER — ADULT MULTIVITAMIN W/MINERALS CH
1.0000 | ORAL_TABLET | Freq: Every day | ORAL | Status: DC
  Administered 2020-12-13: 1 via ORAL
  Filled 2020-12-13: qty 1

## 2020-12-13 MED ORDER — OLANZAPINE 5 MG PO TABS
5.0000 mg | ORAL_TABLET | Freq: Every day | ORAL | Status: DC
  Administered 2020-12-13: 5 mg via ORAL
  Filled 2020-12-13: qty 1

## 2020-12-13 MED ORDER — CHLORDIAZEPOXIDE HCL 25 MG PO CAPS: 25.0000 mg | ORAL_CAPSULE | Freq: Four times a day (QID) | ORAL | Status: DC | PRN

## 2020-12-13 MED ORDER — CHLORDIAZEPOXIDE HCL 25 MG PO CAPS: 25.0000 mg | ORAL_CAPSULE | ORAL | Status: DC

## 2020-12-13 MED ORDER — ONDANSETRON 4 MG PO TBDP: 4.0000 mg | ORAL_TABLET | Freq: Four times a day (QID) | ORAL | Status: DC | PRN

## 2020-12-13 MED ORDER — TRAZODONE HCL 50 MG PO TABS
50.0000 mg | ORAL_TABLET | Freq: Every evening | ORAL | Status: DC | PRN
  Administered 2020-12-13: 50 mg via ORAL
  Filled 2020-12-13: qty 1

## 2020-12-13 MED ORDER — AMLODIPINE BESYLATE 5 MG PO TABS
2.5000 mg | ORAL_TABLET | Freq: Once | ORAL | Status: AC
  Administered 2020-12-13: 2.5 mg via ORAL
  Filled 2020-12-13: qty 1

## 2020-12-13 MED ORDER — DIVALPROEX SODIUM ER 250 MG PO TB24
250.0000 mg | ORAL_TABLET | Freq: Every day | ORAL | Status: DC
  Administered 2020-12-13: 250 mg via ORAL
  Filled 2020-12-13: qty 1

## 2020-12-13 MED ORDER — ALUM & MAG HYDROXIDE-SIMETH 200-200-20 MG/5ML PO SUSP: 30.0000 mL | ORAL | Status: DC | PRN

## 2020-12-13 MED ORDER — CHLORDIAZEPOXIDE HCL 25 MG PO CAPS
25.0000 mg | ORAL_CAPSULE | Freq: Every day | ORAL | Status: DC
Start: 2020-12-16 — End: 2020-12-14

## 2020-12-13 MED ORDER — THIAMINE HCL 100 MG PO TABS
100.0000 mg | ORAL_TABLET | Freq: Every day | ORAL | Status: DC
  Administered 2020-12-14: 100 mg via ORAL
  Filled 2020-12-13: qty 1

## 2020-12-13 MED ORDER — LOPERAMIDE HCL 2 MG PO CAPS: 2.0000 mg | ORAL_CAPSULE | ORAL | Status: DC | PRN

## 2020-12-13 NOTE — ED Notes (Signed)
GIVEN LUNCH 

## 2020-12-13 NOTE — ED Notes (Signed)
Pt sleeping@this time. breathing even and unlabored. Will continue to monitor for safety 

## 2020-12-13 NOTE — ED Provider Notes (Signed)
Per chart review, patient's valproic acid level results are still pending.  Spoke with [facility] via phone, who confirmed that patient's valproic acid level labs had to be sent out to [facility] due to [facility] having issues with running the valproic acid level.  Spoke with [facility] via phone, who stated that they just received a valproic acid level at 1917 and stated that there would be a 3-day turnaround for this lab to result.  Due to patient's current dose of Depakote ER 250 mg p.o. daily at bedtime not being a high-dose, believe that patient can still receive her Depakote ER 250 mg this evening without the valproic acid level results being back at this time.  Patient to receive Depakote ER 250 mg p.o. at bedtime this evening.  Nursing to monitor patient for safety.

## 2020-12-13 NOTE — Progress Notes (Signed)
CSW contacted by Gardiner Sleeper at [facility] and she inquired regarding blood pressure medications and updated vitals. CSW stated that he would let staff know. Contact ended without issues.  CSW asked provider about blood pressure medication. CSW was informed that medication had been ordered.   CSW let staff know that new set of vitals were requested. CSW was informed that vitals would be taken in an hour to allow medication time to take effect. New vitals taken.  New vitals and medication list faxed over to [facility] at [phone].  CSW awaiting update from [facility] facility QP.  Sylvia Arellano. Algis Greenhouse, MSW, LCSW, LCAS 12/13/2020 1:33 PM

## 2020-12-13 NOTE — Progress Notes (Signed)
CSW faxed information to [facility]. CSW to follow up regarding decision.  Vilma Meckel. Algis Greenhouse, MSW, LCSW, LCAS 12/13/2020 9:39 AM

## 2020-12-13 NOTE — BH Assessment (Signed)
Comprehensive Clinical Assessment (CCA) Note  12/13/2020 Sylvia Arellano [id number]  Discharge Disposition: Margorie John, PA-C, reviewed pt's chart and information and met with pt face-to-face and determined pt should be observed overnight for stability and re-assessed by psychiatry in the morning.  The patient demonstrates the following risk factors for suicide: Chronic risk factors for suicide include: psychiatric disorder of Bipolar I disorder, Current or most recent episode unspecified and previous self-harm years ago . Acute risk factors for suicide include: social withdrawal/isolation and loss (financial, interpersonal, professional). Protective factors for this patient include: hope for the future. Considering these factors, the overall suicide risk at this point appears to be none. Patient is not appropriate for outpatient follow up.  Therefore, no sitter is recommended for suicide precautions at this time.  [place] ED from 12/13/2020 in [facility] ED from 12/07/2020 in [facility] ED from 05/26/2020 in [place] DEPT  C-SSRS RISK CATEGORY No Risk No Risk Error: Q7 should not be populated when Q6 is No     Chief Complaint: No chief complaint on file.  Visit Diagnosis: F31.9, Bipolar I disorder, Current or most recent episode unspecified  CCA Screening, Triage and Referral (STR) Sylvia Arellano is a 50 year old patient who voluntarily came to the [facility]). For details on pt's initial presentation, please refer to the note written by this clinician with the service date and time as 12/13/2020 at 5:34 AM.  Upon pt's return to the lobby, she was willing to sit down with clinician and PA-C. Clinician and PA-C offered numerous times to move to the triage room, the back assessment area, or the triage room, but pt continuously declined the offer. Pt answered some of the questions  posed, but at times was either ignored them or had word salad. There were also other questions that pt expressed not wanting to answer because she was in the lobby, but when again encouraged by clinician and PA-C to move to the triage room or to the children's lobby pt refused.  Pt and clinician explored pt's different options, including but not limited to d/c, overnight observation, information for services on the 2nd floor of the [facility], or inpt. Pt shared difficulties she has had filling her prescriptions, though the information she provided did not entirely make sense. She also showed clinician the medication she keeps in her purse in a pill bottle; in it she had Benadryl (pink pills), Advil (white pills), night-time Tylenol (blue pills), and yellow circular-ish clumps that pt stated she, "takes so I don't have to drink as much coffee." At this time, it is unclear what those yellow clumps were.  Pt denies SI, HI, VH, NSSIB, engagement with the legal system, or SA. Pt was guarded in regards to providing information about her substance use. PT endorsed experiencing AH.  Pt's orientation and memory were UTA. Pt was guarded throughout the assessment process. Pt's insight, judgement, and impulse control is poor at this time.  Patient Reported Information How did you hear about [place]? Self  What Is the Reason for Your Visit/Call Today? Pt states she was unable to fill her prescriptions and needs to be back on her medication.  How Long Has This Been Causing You Problems? > than 6 months  What Do You Feel Would Help You the Most Today? Medication(s); Treatment for Depression or other mood problem   Have You Recently Had Any Thoughts About Hurting Yourself? No  Are You  Planning to Commit Suicide/Harm Yourself At This time? No   Have you Recently Had Thoughts About [place]? No  Are You Planning to Harm Someone at This Time? No  Explanation: No data recorded  Have You Used Any Alcohol  or Drugs in the Past 24 Hours? Yes  How Long Ago Did You Use Drugs or Alcohol? No data recorded What Did You Use and How Much? Pt declined to answer this question other than to say she consumed EtOH.   Do You Currently Have a Therapist/Psychiatrist? No  Name of Therapist/Psychiatrist: Na ([employer] 05/26/2020)   Have You Been Recently Discharged From Any Office Practice or Programs? No  Explanation of Discharge From Practice/Program: No data recorded    CCA Screening Triage Referral Assessment Type of Contact: Face-to-Face  Telemedicine Service Delivery:   Is this Initial or Reassessment? No data recorded Date Telepsych consult ordered in [facility]:  No data recorded Time Telepsych consult ordered in [facility]:  No data recorded Location of Assessment: [facility]  Provider Location: [facility] Assessment Services   Collateral Involvement: None at this time   Does Patient Have a [place]? No data recorded Name and Contact of Legal Guardian: No data recorded If Minor and Not Living with Parent(s), Who has Custody? N/A  Is CPS involved or ever been involved? Never  Is APS involved or ever been involved? Never   Patient Determined To Be At Risk for Harm To Self or Others Based on Review of Patient Reported Information or Presenting Complaint? No  Method: No data recorded Availability of Means: No data recorded Intent: No data recorded Notification Required: No data recorded Additional Information for Danger to Others Potential: No data recorded Additional Comments for Danger to Others Potential: No data recorded Are There Guns or Other Weapons in Your Home? No data recorded Types of Guns/Weapons: No data recorded Are These Weapons Safely Secured?                            No data recorded Who Could Verify You Are Able To Have These Secured: No data recorded Do You Have any Outstanding Charges, Pending Court Dates, Parole/Probation? No data  recorded Contacted To Inform of Risk of Harm To Self or Others: -- (N/A)    Does Patient Present under Involuntary Commitment? No  IVC Papers Initial File Date: No data recorded  [place] of Residence: [place]   Patient Currently Receiving the Following Services: Not Receiving Services   Determination of Need: Urgent (48 hours)   Options For Referral: Medication Management; Outpatient Therapy; Mobile Urgent Care     CCA Biopsychosocial Patient Reported Schizophrenia/Schizoaffective Diagnosis in Past: No   Strengths: Pt states she is attempting to maintain employment by getting back on her medication. She states she was recently approved for Medicaid and can, thus, ensure her mental and physical health needs are met.   Mental Health Symptoms Depression:   Change in energy/activity; Difficulty Concentrating; Fatigue; Hopelessness; Worthlessness; Sleep (too much or little)   Duration of Depressive symptoms:  Duration of Depressive Symptoms: Greater than two weeks   Mania:   Racing thoughts   Anxiety:    Tension; Worrying   Psychosis:   None   Duration of Psychotic symptoms:  Duration of Psychotic Symptoms: N/A   Trauma:   None   Obsessions:   None   Compulsions:   None   Inattention:   None  Hyperactivity/Impulsivity:   None   Oppositional/Defiant Behaviors:   None   Emotional Irregularity:   Mood lability; Potentially harmful impulsivity   Other Mood/Personality Symptoms:   Pt is having difficulties making decisions and changes her mind frequently.    Mental Status Exam Appearance and self-care  Stature:   Average   Weight:   Thin   Clothing:   Casual   Grooming:   Normal   Cosmetic use:   Age appropriate   Posture/gait:   Normal   Motor activity:   Not Remarkable   Sensorium  Attention:   Distractible   Concentration:   Focuses on irrelevancies; Preoccupied; Scattered   Orientation:   -- (UTA)   Recall/memory:    Normal   Affect and Mood  Affect:   Anxious (Confused)   Mood:   Anxious   Relating  Eye contact:   None   Facial expression:   Responsive   Attitude toward examiner:   Guarded   Thought and Language  Speech flow:  Pressured; Slurred; Flight of Ideas   Thought content:   Appropriate to Mood and Circumstances   Preoccupation:   None   Hallucinations:   Auditory   Organization:  No data recorded  [employer] of Knowledge:   Fair   Intelligence:   Average   Abstraction:   Functional   Judgement:   Poor   Reality Testing:   Adequate   Insight:   Poor   Decision Making:   Only simple; Impulsive   Social Functioning  Social Maturity:   Impulsive   Social Judgement:   Heedless   Stress  Stressors:   Housing; Museum/gallery curator; Work   Coping Ability:   Overwhelmed; Exhausted   Skill Deficits:   Decision making; Self-control   Supports:   Family     Religion: Religion/Spirituality Are You A Religious Person?:  (Not assessed) How Might This Affect Treatment?: Not assessed  Leisure/Recreation: Leisure / Recreation Do You Have Hobbies?:  (Not assessed) Leisure and Hobbies: Pt states she watches television  Exercise/Diet: Exercise/Diet Do You Exercise?:  (Not assessed) Have You Gained or Lost A Significant Amount of Weight in the Past Six Months?:  (Not assessed) Do You Follow a Special Diet?:  (Not assessed) Do You Have Any Trouble Sleeping?:  (Not assessed) Explanation of Sleeping Difficulties: Not assessed   CCA Employment/Education Employment/Work Situation: Employment / Work Situation Employment Situation: Employed Work Stressors: Pt shares she is having difficulties paying to get to and from work due to lack of transportation. Patient's Job has Been Impacted by Current Illness: Yes Describe how Patient's Job has Been Impacted: Pt shares she is having difficulties paying to get to and from work due to lack of  transportation. Has Patient ever Been in the [employer]?: No  Education: Education Is Patient Currently Attending School?: No Last Grade Completed:  (Not assessed) Did You Attend College?:  (Not assessed) Did You Have An Individualized Education Program (IIEP):  (Not assessed) Did You Have Any Difficulty At School?:  (Not assessed) Patient's Education Has Been Impacted by Current Illness:  (Not assessed)   CCA Family/Childhood History Family and Relationship History: Family history Marital status:  (Not assessed) Does patient have children?: Yes How many children?: 2 How is patient's relationship with their children?: Pt shares she has a good relationship with her children.  Childhood History:  Childhood History By whom was/is the patient raised?: Mother Did patient suffer any verbal/emotional/physical/sexual abuse as a child?: No Did patient  suffer from severe childhood neglect?: No Has patient ever been sexually abused/assaulted/raped as an adolescent or adult?: No Was the patient ever a victim of a crime or a disaster?: No Witnessed domestic violence?: No Has patient been affected by domestic violence as an adult?: No  Child/Adolescent Assessment:     CCA Substance Use Alcohol/Drug Use: Alcohol / Drug Use Pain Medications: See MAR Prescriptions: See MAR Over the Counter: See MAR History of alcohol / drug use?: Yes Longest period of sobriety (when/how long): Unknown Negative Consequences of Use:  (Pt denies) Withdrawal Symptoms:  (Pt denies) Substance #1 Name of Substance 1: Cocaine 1 - Age of First Use: Unknown 1 - Amount (size/oz): Unknown 1 - Frequency: Unknown 1 - Duration: Unknown 1 - Last Use / Amount: Pt's UDA on 12/07/2020 was positive for cocaine; pt denied use of any substance other than EtOH. 1 - Method of Aquiring: Unknown 1- Route of Use: Unknown Substance #2 Name of Substance 2: EtOH 2 - Age of First Use: Unknown 2 - Amount (size/oz): 3 12-ounce  beers 2 - Frequency: 3x/week 2 - Duration: Unknown 2 - Last Use / Amount: 12/07/2020 (today) 2 - Method of Aquiring: Purchase 2 - Route of Substance Use: Oral (drank)                     ASAM's:  Six Dimensions of Multidimensional Assessment  Dimension 1:  Acute Intoxication and/or Withdrawal Potential:   Dimension 1:  Description of individual's past and current experiences of substance use and withdrawal: UTA  Dimension 2:  Biomedical Conditions and Complications:   Dimension 2:  Description of patient's biomedical conditions and  complications: UTA  Dimension 3:  Emotional, Behavioral, or Cognitive Conditions and Complications:  Dimension 3:  Description of emotional, behavioral, or cognitive conditions and complications: UTA  Dimension 4:  Readiness to Change:  Dimension 4:  Description of Readiness to Change criteria: UTA  Dimension 5:  Relapse, Continued use, or Continued Problem Potential:  Dimension 5:  Relapse, continued use, or continued problem potential critiera description: UTA  Dimension 6:  Recovery/Living Environment:  Dimension 6:  Recovery/Iiving environment criteria description: UTA  ASAM Severity Score:    ASAM Recommended Level of Treatment: ASAM Recommended Level of Treatment:  (UTA - pt shares she has no difficulties due to her EtOH use and did not share her active use of cocaine when asked)   Substance use Disorder (SUD) Substance Use Disorder (SUD)  Checklist Symptoms of Substance Use:  (UTA - pt shares she has no difficulties due to her EtOH use and did not share her active use of cocaine when asked)  Recommendations for Services/Supports/Treatments: Recommendations for Services/Supports/Treatments Recommendations For Services/Supports/Treatments: Other (Comment), Individual Therapy, Medication Management (Overnight observation)  Discharge Disposition: Discharge Disposition Medical Exam completed: Yes Disposition of Patient:  (Overnight obs at  [facility]) Mode of transportation if patient is discharged/movement?: N/A  Margorie John, PA-C, reviewed pt's chart and information and met with pt face-to-face and determined pt should be observed overnight for stability and re-assessed by psychiatry in the morning.  DSM5 Diagnoses: Patient Active Problem List   Diagnosis Date Noted   Bipolar 1 disorder ([place]) 05/26/2020   Bipolar I disorder ([place])    Bipolar disord, crnt episode manic w/o psych features, mod ([place]) 01/17/2018   Generalized anxiety disorder 01/17/2018   Reaction to severe stress, unspecified 01/17/2018   Personality disorder, unspecified ([place]) 01/17/2018   Cocaine dependence, uncomplicated ([place]) 03/20/5944   Cocaine use  Encephalopathy, hypertensive    TIA (transient ischemic attack) 07/09/2014   Hypertensive urgency 07/09/2014   Cocaine abuse ([place]) 07/09/2014   Alcohol abuse, daily use 07/09/2014   Tobacco abuse 07/09/2014   History of seizure 07/09/2014   Bipolar 2 disorder ([place]) 07/09/2014   Hypokalemia 07/09/2014   Cerebral infarction due to unspecified mechanism      Referrals to Alternative Service(s): Referred to Alternative Service(s):   Place:   Date:   Time:    Referred to Alternative Service(s):   Place:   Date:   Time:    Referred to Alternative Service(s):   Place:   Date:   Time:    Referred to Alternative Service(s):   Place:   Date:   Time:     Dannielle Burn, LMFT

## 2020-12-13 NOTE — Progress Notes (Signed)
CSW notified that pt declined at [facility] due to reported history of seizures.   Vilma Meckel. Algis Greenhouse, MSW, LCSW, LCAS 12/13/2020 10:25 AM

## 2020-12-13 NOTE — Progress Notes (Signed)
CSW phoned [facility] to speak with Westley Hummer, QP regarding their admission process. Unable to make contact but HIPPA compliant voicemail left with contact information for follow up.   Vilma Meckel. Algis Greenhouse, MSW, LCSW, LCAS 12/13/2020 9:44 AM

## 2020-12-13 NOTE — Progress Notes (Signed)
Pt is admitted Flex bed 1 due substance abuse and ETOH. Pt is alert and oriented. Pt is ambulatory and is oriented to staff/unit. Pt complained of headache. PRN Tylenol and scheduled meds administered with no incident. Pt's CIWA score is 6. Pt denies SI/HI/AVH at this time. Staff will monitor for pt's safety.

## 2020-12-13 NOTE — Progress Notes (Signed)
CSW met with pt briefly to discuss detox beds. Pt stated that she would like to remain close to home because she does not want to lose her job. Pt has lots of questions regarding detox versus residential. CSW answered questions, however, pt appeared to struggle with what she wanted. She asked if we could talk about it later. CSW clarified that information could be sent but that it did not guarantee a bed at this moment. CSW asked if it was ok to send information to places that do have bed availability. She agreed, stating that she feels she needs to do something different because she cannot keep coming back and forth here. No other concerns expressed. Contact ended without incident.   Chalmers Guest. Guerry Bruin, MSW, [place], [place] 12/13/2020 8:54 AM

## 2020-12-13 NOTE — Progress Notes (Signed)
CSW received phone call from [place], [place] at [facility]. She stated that paperwork be sent over including med list, vital signs, progress notes, and an assessment to [phone]. CSW agreed. No other concerns expressed. Contact ended without incident.   CSW faxed over requested paperwork.   Vilma Meckel. Algis Greenhouse, MSW, LCSW, LCAS 12/13/2020 10:17 AM

## 2020-12-13 NOTE — ED Provider Notes (Addendum)
Behavioral Health Admission H&P [facility] & OBS)  Date: 12/13/20 Patient Name: Sylvia Arellano MRN: [id number] Chief Complaint: No chief complaint on file.   "I need help"  Diagnoses:  Final diagnoses:  Bipolar 1 disorder (HCC)  Alcohol abuse, daily use   Depakote level ordered due to patient being a poor historian regarding her medications. Patient's order for Depakote 250 mg ER p.o, daily at bedtime will need to be discontinued if patient's depakote level comes back elevated.   HPI: Sylvia Arellano is a 50 year old female with past psychiatric history significant for bipolar 1 disorder, bipolar 2 disorder, cocaine abuse/dependence, and alcohol abuse, and history of seizures (patient has reported history of nonalcohol withdrawal related seizures ),who presents to [facility] unaccompanied as a voluntary walk-in.  Patient reports that she is feeling hopeless and worthless.  Patient states that she would like help with her mental health.  Patient presented to [facility] on 12/07/2020 and was admitted to [facility] overnight continuous assessment on 12/07/2020.  Patient was then reevaluated by day shift treatment team on 12/08/2020.  Per chart review of 12/08/2020 reevaluation, patient declined the option of inpatient substance abuse treatment and stated that she would potentially be interested in engaging in outpatient substance abuse treatment at a later time.  Patient was provided with paper prescriptions for Zyprexa, Depakote, and hydroxyzine, as well as resources for outpatient substance abuse treatment and [facility] open access hours and was discharged from [facility] on 12/08/2020.  Per chart review, it appears that upon 12/08/20 [facility] discharge, patient received prescriptions for thiamine 100 mg p.o. daily (30 tablets, 0 refills), multivitamin with minerals, Depakote ER 250 mg p.o. daily at bedtime (30 tablets, 0 refills), Zyprexa 5 mg p.o. daily at bedtime (30 tablets, 0 refills), and Vistaril 25 mg  p.o. 3 times daily as needed (90 tablets, 0 refills).  Patient states that she was provided with 7-day samples of these prescriptions mentioned above and patient states that she has finished her 7-day samples of these prescriptions (Of note, it has not been 7 days since patient reportedly received her 7-day samples on 12/08/2020).  Patient states that upon discharge from [facility] on 12/08/20, patient took what she thought were paper prescriptions for the medications mentioned above to a [facility] and patient states that she was unable to fill these prescriptions there.  When patient is asked why she was not able to fill these prescriptions, patient becomes tangential and does not provide a logical answer for why she was not able to fill these prescriptions.  Patient does state that the pharmacist told her at the pharmacy that the documents she brought that the patient thought were paper prescriptions were not actually prescriptions.  Patient states that she does not have these paper prescriptions at this time.  Patient then takes a prescription bottle out of her purse (the bottle appears to be a 7-day sample bottle for thiamine that was provided to the patient at patient's last [facility] encounter), empties the bottle, and shows numerous pills in this bottle.  Patient states that she keeps all of her medications in this bottle.  I am unable to determine what these medications are upon examining these medications.  Patient states that she followed up with [facility] second-floor during open access hours multiple times since being discharged from [facility] on 12/08/2020.  However, per my chart review, I cannot find any records of the patient being evaluated at [facility] second-floor.  Patient  is a poor historian and is unable to provide accurate answers regarding when she last took any psychotropic medications since she was last at [facility].   Patient denies SI.  Patient  states that she has not attempted suicide or engaged in self-injurious behavior via cutting since she was last at [facility] on 12/08/2020.  Patient denies HI.  Patient endorses auditory hallucinations on exam and endorses having auditory hallucinations "all my life", but patient does not provide further details when asked to provide further details regarding her auditory hallucinations.  Patient denies visual hallucinations.  Patient denies access to firearms.  When patient is asked about alcohol use, patient states that she drinks "more than I want to".  When patient is asked about frequency/quantity of alcohol use, patient does not provide an answer.  Patient states that her last alcohol use was on the evening of 12/12/2020.  When patient is asked how much alcohol she drank at that time, patient leans in and whispers that she drank one 6 pack of beer at that time.  Patient denies additional substance use.  Of note, patient's last UDS on 12/07/2020 was positive for cocaine and patient's last ethanol level on 12/07/2020 was elevated at 127 mg/dL.  Additionally, upon my previous assessment of the patient on 12/07/2020, patient initially stated that she was not using illicit substances and then stated that she was using cocaine.  Patient denies history of alcohol withdrawal symptoms and patient denied this on my previous assessment on 12/07/2020 as well.  Patient reports that she has not had any seizures since she was last at [facility] on 12/08/20.   On exam, patient appears to be restless.  Patient is tearful on exam, but in no acute distress.  Speech is garbled and slurred.  Patient's mood is dysphoric and irritable with congruent affect.  Patient appears to be intoxicated on exam.  Patient provides inappropriate answers to questions at times.  Patient appears to be manic, delusional, and tangential on exam, as well as experiencing some psychosis.  Difficult to determine if patient is responding to internal or external stimuli at  this time.  PHQ 2-9:  Flowsheet Row ED from 05/26/2020 in [facility]  Thoughts that you would be better off dead, or of hurting yourself in some way Nearly every day  [Phreesia 05/26/2020]  PHQ-9 Total Score 24       Flowsheet Row ED from 12/07/2020 in [facility] Most recent reading at 12/08/2020 12:32 AM ED from 05/26/2020 in [facility] Most recent reading at 05/26/2020 11:33 PM ED from 05/26/2020 in [facility] Most recent reading at 05/26/2020 10:46 AM  C-SSRS RISK CATEGORY No Risk Error: Q7 should not be populated when Q6 is No No Risk        Total Time spent with patient: 30 minutes  Musculoskeletal  Strength & Muscle Tone: within normal limits Gait & Station:  Slightly unsteady, but patient able to ambulate independently Patient leans: N/A  Psychiatric Specialty Exam  Presentation General Appearance: Bizarre; Disheveled  Eye Contact:Fair; Fleeting  Speech:Garbled; Slurred  Speech Volume:Normal  Handedness:No data recorded  Mood and Affect  Mood:Dysphoric; Irritable  Affect:Congruent   Thought Process  Thought Processes:Disorganized  Descriptions of Associations:Tangential  Orientation:Full (Time, Place and Person)  Thought Content:Tangential; Scattered; Delusions  Diagnosis of Schizophrenia or Schizoaffective disorder in past: No  Duration of Psychotic Symptoms: Greater than six months  Hallucinations:Hallucinations: Auditory Description of Auditory Hallucinations: See  HPI for details.  Ideas of Reference:Delusions  Suicidal Thoughts:Suicidal Thoughts: No  Homicidal Thoughts:Homicidal Thoughts: No   Sensorium  Memory:Immediate Poor; Recent Poor; Remote Poor  Judgment:Poor  Insight:Lacking   Executive Functions  Concentration:Poor  Attention Span:Poor  Recall:Poor  Fund of Knowledge:Poor  Language:Fair   Psychomotor  Activity  Psychomotor Activity:Psychomotor Activity: Restlessness   Assets  Assets:Communication Skills; Desire for Improvement; Leisure Time; Physical Health; Resilience; Vocational/Educational   Sleep  Sleep:Sleep: Poor   Nutritional Assessment (For OBS and [facility]BC admissions only) Has the patient had a weight loss or gain of 10 pounds or more in the last 3 months?: No Has the patient had a decrease in food intake/or appetite?: Yes Does the patient have dental problems?: No Does the patient have eating habits or behaviors that may be indicators of an eating disorder including binging or inducing vomiting?: No Has the patient recently lost weight without trying?: No Has the patient been eating poorly because of a decreased appetite?: Yes Malnutrition Screening Tool Score: 1   Physical Exam Vitals reviewed.  Constitutional:      General: She is not in acute distress.    Appearance: She is not ill-appearing, toxic-appearing or diaphoretic.  HENT:     Head: Normocephalic and atraumatic.     Right Ear: External ear normal.     Left Ear: External ear normal.     Nose: Nose normal.  Eyes:     General:        Right eye: No discharge.        Left eye: No discharge.     Conjunctiva/sclera: Conjunctivae normal.  Cardiovascular:     Rate and Rhythm: Tachycardia present.  Pulmonary:     Effort: Pulmonary effort is normal. No respiratory distress.  Musculoskeletal:        General: Normal range of motion.     Cervical back: Normal range of motion.  Neurological:     General: No focal deficit present.     Mental Status: She is alert and oriented to person, place, and time.     Comments: Gait slightly unsteady, but patient able to ambulate independently without issue.   Psychiatric:        Attention and Perception: She perceives auditory hallucinations. She does not perceive visual hallucinations.        Behavior: Behavior is hyperactive. Behavior is not agitated, slowed, aggressive,  withdrawn or combative.        Thought Content: Thought content is delusional. Thought content does not include homicidal or suicidal ideation.     Comments: Mood is dysphoric and irritable with congruent affect. Speech is slurred and garbled. Patient is minimally cooperative on exam.    Review of Systems  Constitutional:  Positive for malaise/fatigue. Negative for chills, diaphoresis, fever and weight loss.  HENT:  Negative for congestion.   Respiratory:  Negative for cough and shortness of breath.   Cardiovascular:  Negative for chest pain and palpitations.  Gastrointestinal:  Negative for abdominal pain, constipation, diarrhea, nausea and vomiting.  Musculoskeletal:  Negative for joint pain and myalgias.  Neurological:  Positive for seizures and headaches. Negative for dizziness and tremors.  Psychiatric/Behavioral:  Positive for depression and hallucinations. Negative for memory loss and suicidal ideas. The patient is nervous/anxious and has insomnia.        Patient denies additional substance use.  Of note, patient's last UDS on 12/07/2020 was positive for cocaine and patient's last ethanol level on 12/07/2020 was elevated at 127 mg/dL.  Additionally,  upon my previous assessment of the patient on 12/07/2020, patient initially stated that she was not using illicit substances and then stated that she was using cocaine.  Patient denies history of alcohol withdrawal symptoms and patient denied this on my previous assessment on 12/07/2020 as well.   All other systems reviewed and are negative.  Vitals: Blood pressure (!) 163/132, pulse (!) 106, temperature 98.7 F (37.1 C), temperature source Oral, resp. rate 18, SpO2 98 %. There is no height or weight on file to calculate BMI.  Past Psychiatric History: bipolar 1 disorder, bipolar 2 disorder, cocaine abuse/dependence, and alcohol abuse, [facility] hospitalization in 05/2020  Is the patient at risk to self? Yes  Has the patient been a risk to self in  the past 6 months? Yes .    Has the patient been a risk to self within the distant past? No   Is the patient a risk to others? No   Has the patient been a risk to others in the past 6 months? No   Has the patient been a risk to others within the distant past? No   Past Medical History:  Past Medical History:  Diagnosis Date   Bipolar 2 disorder (HCC)    Seizures (HCC)    Suicide attempt (HCC)    No past surgical history on file.  Family History: No family history on file.  Social History:  Social History   Socioeconomic History   Marital status: Single    Spouse name: Not on file   Number of children: Not on file   Years of education: Not on file   Highest education level: Not on file  Occupational History   Not on file  Tobacco Use   Smoking status: Every Day    Packs/day: 0.50    Types: Cigarettes   Smokeless tobacco: Never  Vaping Use   Vaping Use: Never used  Substance and Sexual Activity   Alcohol use: Yes    Comment: "twice a week"   Drug use: Yes    Types: Cocaine   Sexual activity: Yes    Birth control/protection: Condom  Other Topics Concern   Not on file  Social History Narrative   Not on file   Social Determinants of Health   Financial Resource Strain: Not on file  Food Insecurity: Not on file  Transportation Needs: Not on file  Physical Activity: Not on file  Stress: Not on file  Social Connections: Not on file  Intimate Partner Violence: Not on file    SDOH:  SDOH Screenings   Alcohol Screen: Low Risk    Last Alcohol Screening Score (AUDIT): 5  Depression (PHQ2-9): Medium Risk   PHQ-2 Score: 24  Financial Resource Strain: Not on file  Food Insecurity: Not on file  Housing: Not on file  Physical Activity: Not on file  Social Connections: Not on file  Stress: Not on file  Tobacco Use: High Risk   Smoking Tobacco Use: Every Day   Smokeless Tobacco Use: Never  Transportation Needs: Not on file    Last Labs:  Admission on  12/07/2020, Discharged on 12/08/2020  Component Date Value Ref Range Status   SARS Coronavirus 2 by RT PCR 12/07/2020 NEGATIVE  NEGATIVE Final   Comment: (NOTE) SARS-CoV-2 target nucleic acids are NOT DETECTED.  The SARS-CoV-2 RNA is generally detectable in upper respiratory specimens during the acute phase of infection. The lowest concentration of SARS-CoV-2 viral copies this assay can detect is 138 copies/mL. A negative  result does not preclude SARS-Cov-2 infection and should not be used as the sole basis for treatment or other patient management decisions. A negative result may occur with  improper specimen collection/handling, submission of specimen other than nasopharyngeal swab, presence of viral mutation(s) within the areas targeted by this assay, and inadequate number of viral copies(<138 copies/mL). A negative result must be combined with clinical observations, patient history, and epidemiological information. The expected result is Negative.  Fact Sheet for Patients:  BloggerCourse.com  Fact Sheet for Healthcare Providers:  SeriousBroker.it  This test is no                          t yet approved or cleared by the [place] FDA and  has been authorized for detection and/or diagnosis of SARS-CoV-2 by FDA under an Emergency Use Authorization (EUA). This EUA will remain  in effect (meaning this test can be used) for the duration of the COVID-19 declaration under Section 564(b)(1) of the Act, 21 U.S.C.section 360bbb-3(b)(1), unless the authorization is terminated  or revoked sooner.       Influenza A by PCR 12/07/2020 NEGATIVE  NEGATIVE Final   Influenza B by PCR 12/07/2020 NEGATIVE  NEGATIVE Final   Comment: (NOTE) The Xpert Xpress SARS-CoV-2/FLU/RSV plus assay is intended as an aid in the diagnosis of influenza from Nasopharyngeal swab specimens and should not be used as a sole basis for treatment. Nasal washings  and aspirates are unacceptable for Xpert Xpress SARS-CoV-2/FLU/RSV testing.  Fact Sheet for Patients: BloggerCourse.com  Fact Sheet for Healthcare Providers: SeriousBroker.it  This test is not yet approved or cleared by the [place] FDA and has been authorized for detection and/or diagnosis of SARS-CoV-2 by FDA under an Emergency Use Authorization (EUA). This EUA will remain in effect (meaning this test can be used) for the duration of the COVID-19 declaration under Section 564(b)(1) of the Act, 21 U.S.C. section 360bbb-3(b)(1), unless the authorization is terminated or revoked.  Performed at [facility], [address] [place], [place] [address]    SARS Coronavirus 2 Ag 12/07/2020 Negative  Negative Preliminary   WBC 12/07/2020 6.5  4.0 - 10.5 K/uL Final   RBC 12/07/2020 4.83  3.87 - 5.11 MIL/uL Final   Hemoglobin 12/07/2020 14.1  12.0 - 15.0 g/dL Final   HCT 58/01/9832 42.3  36.0 - 46.0 % Final   MCV 12/07/2020 87.6  80.0 - 100.0 fL Final   MCH 12/07/2020 29.2  26.0 - 34.0 pg Final   MCHC 12/07/2020 33.3  30.0 - 36.0 g/dL Final   RDW 82/50/5397 13.1  11.5 - 15.5 % Final   Platelets 12/07/2020 251  150 - 400 K/uL Final   nRBC 12/07/2020 0.0  0.0 - 0.2 % Final   Neutrophils Relative % 12/07/2020 44  % Final   Neutro Abs 12/07/2020 2.8  1.7 - 7.7 K/uL Final   Lymphocytes Relative 12/07/2020 48  % Final   Lymphs Abs 12/07/2020 3.1  0.7 - 4.0 K/uL Final   Monocytes Relative 12/07/2020 7  % Final   Monocytes Absolute 12/07/2020 0.5  0.1 - 1.0 K/uL Final   Eosinophils Relative 12/07/2020 0  % Final   Eosinophils Absolute 12/07/2020 0.0  0.0 - 0.5 K/uL Final   Basophils Relative 12/07/2020 1  % Final   Basophils Absolute 12/07/2020 0.0  0.0 - 0.1 K/uL Final   Immature Granulocytes 12/07/2020 0  % Final   Abs Immature Granulocytes 12/07/2020 0.01  0.00 - 0.07 K/uL Final   Performed at [facility], [address] [place], [place] [address]   Sodium 12/07/2020 137  135 - 145 mmol/L Final   Potassium 12/07/2020 4.1  3.5 - 5.1 mmol/L Final   Chloride 12/07/2020 103  98 - 111 mmol/L Final   CO2 12/07/2020 21 (A) 22 - 32 mmol/L Final   Glucose, Bld 12/07/2020 76  70 - 99 mg/dL Final   Glucose reference range applies only to samples taken after fasting for at least 8 hours.   BUN 12/07/2020 10  6 - 20 mg/dL Final   Creatinine, Ser 12/07/2020 0.76  0.44 - 1.00 mg/dL Final   Calcium 65/78/4696 10.0  8.9 - 10.3 mg/dL Final   Total Protein 29/52/8413 8.4 (A) 6.5 - 8.1 g/dL Final   Albumin 24/40/1027 4.8  3.5 - 5.0 g/dL Final   AST 25/36/6440 36  15 - 41 U/L Final   ALT 12/07/2020 32  0 - 44 U/L Final   Alkaline Phosphatase 12/07/2020 75  38 - 126 U/L Final   Total Bilirubin 12/07/2020 0.6  0.3 - 1.2 mg/dL Final   GFR, Estimated 12/07/2020 >60  >60 mL/min Final   Comment: (NOTE) Calculated using the CKD-EPI Creatinine Equation (2021)    Anion gap 12/07/2020 13  5 - 15 Final   Performed at [facility], [address], [place], [place] [address]   Hgb A1c MFr Bld 12/07/2020 6.0 (A) 4.8 - 5.6 % Final   Comment: (NOTE) Pre diabetes:          5.7%-6.4%  Diabetes:              >6.4%  Glycemic control for   <7.0% adults with diabetes    Mean Plasma Glucose 12/07/2020 125.5  mg/dL Final   Performed at [facility], [address], [place], [place] [address]   Alcohol, Ethyl (B) 12/07/2020 127 (A) <10 mg/dL Final   Comment: (NOTE) Lowest detectable limit for serum alcohol is 10 mg/dL.  For medical purposes only. Performed at [facility], [address], [place], [place] [address]    TSH 12/07/2020 1.182  0.350 - 4.500 uIU/mL Final   Comment: Performed by a 3rd Generation assay with a functional sensitivity of <=0.01 uIU/mL. Performed at [facility], [address], [place], [place] [address]    POC Amphetamine UR 12/07/2020 None Detected  NONE DETECTED (Cut Off Level  1000 ng/mL) Preliminary   POC Secobarbital (BAR) 12/07/2020 None Detected  NONE DETECTED (Cut Off Level 300 ng/mL) Preliminary   POC Buprenorphine (BUP) 12/07/2020 None Detected  NONE DETECTED (Cut Off Level 10 ng/mL) Preliminary   POC Oxazepam (BZO) 12/07/2020 None Detected  NONE DETECTED (Cut Off Level 300 ng/mL) Preliminary   POC Cocaine UR 12/07/2020 Positive (A) NONE DETECTED (Cut Off Level 300 ng/mL) Preliminary   POC Methamphetamine UR 12/07/2020 None Detected  NONE DETECTED (Cut Off Level 1000 ng/mL) Preliminary   POC Morphine 12/07/2020 None Detected  NONE DETECTED (Cut Off Level 300 ng/mL) Preliminary   POC Oxycodone UR 12/07/2020 None Detected  NONE DETECTED (Cut Off Level 100 ng/mL) Preliminary   POC Methadone UR 12/07/2020 None Detected  NONE DETECTED (Cut Off Level 300 ng/mL) Preliminary   POC Marijuana UR 12/07/2020 None Detected  NONE DETECTED (Cut Off Level 50 ng/mL) Preliminary   Preg Test, Ur 12/07/2020 NEGATIVE  NEGATIVE Final   Comment:  THE SENSITIVITY OF THIS METHODOLOGY IS >24 mIU/mL    SARSCOV2ONAVIRUS 2 AG 12/08/2020 NEGATIVE  NEGATIVE Final   Comment: (NOTE) SARS-CoV-2 antigen NOT DETECTED.   Negative results are presumptive.  Negative results do not preclude SARS-CoV-2 infection and should not be used as the sole basis for treatment or other patient management decisions, including infection  control decisions, particularly in the presence of clinical signs and  symptoms consistent with COVID-19, or in those who have been in contact with the virus.  Negative results must be combined with clinical observations, patient history, and epidemiological information. The expected result is Negative.  Fact Sheet for Patients: https://www.jennings-kim.com/  Fact Sheet for Healthcare Providers: https://alexander-rogers.biz/  This test is not yet approved or cleared by the [place] FDA and  has been authorized for detection and/or  diagnosis of SARS-CoV-2 by FDA under an Emergency Use Authorization (EUA).  This EUA will remain in effect (meaning this test can be used) for the duration of  the COV                          ID-19 declaration under Section 564(b)(1) of the Act, 21 U.S.C. section 360bbb-3(b)(1), unless the authorization is terminated or revoked sooner.     Cholesterol 12/07/2020 251 (A) 0 - 200 mg/dL Final   Triglycerides 46/65/9935 169 (A) <150 mg/dL Final   HDL 70/17/7939 95  >40 mg/dL Final   Total CHOL/HDL Ratio 12/07/2020 2.6  RATIO Final   VLDL 12/07/2020 34  0 - 40 mg/dL Final   LDL Cholesterol 12/07/2020 122 (A) 0 - 99 mg/dL Final   Comment:        Total Cholesterol/HDL:CHD Risk Coronary Heart Disease Risk Table                     Men   Women  1/2 Average Risk   3.4   3.3  Average Risk       5.0   4.4  2 X Average Risk   9.6   7.1  3 X Average Risk  23.4   11.0        Use the calculated Patient Ratio above and the CHD Risk Table to determine the patient's CHD Risk.        ATP III CLASSIFICATION (LDL):  <100     mg/dL   Optimal  030-092  mg/dL   Near or Above                    Optimal  130-159  mg/dL   Borderline  330-076  mg/dL   High  >226     mg/dL   Very High Performed at [facility], [address], [place], [place] [address]     Allergies: Patient has no known allergies.  PTA Medications: (Not in a hospital admission)   Medical Decision Making  Patient is a 50 year old female with history of bipolar disorder, cocaine abuse, and alcohol abuse who presents to [facility] voluntarily.  On exam, patient appears to be intoxicated, delusional, tangential, and manic and appears to be experiencing some psychosis (patient endorses auditory hallucinations but does not provide further details regarding her auditory hallucinations).  Based on patient's current presentation of potential intoxication, mania, and psychosis, believe that the patient is a potential threat to herself at  this time and recommend continuous assessment for the patient.    Recommendations  Based on my evaluation the patient  does not appear to have an emergency medical condition. [facility] for further stabilization and treatment.  Patient will be reevaluated by the treatment team on 12/13/2020 or on 12/14/2020 and disposition to be determined at that time.  Recommend that pharmacy review the medications and patient's pill bottle in her purse noted above in order to determine what these medications are and to determine if these medications are safe for the patient to hold in her possession or if these medications need to be disposed of.  Also recommend that pharmacy discuss with the patient the options for patient to properly receive her medication prescriptions as an outpatient such as the proper pharmacy that patient uses.   Patient's blood pressure is also elevated at 163/132.  Recommend that dayshift recheck patient's blood pressure upon bringing patient back to the continuous assessment unit and recommend that patient receive medication for blood pressure at that time if necessary upon blood pressure rechecked.  Labs ordered and reviewed:  -PCR COVID: To be collected on day shift  -CBC with differential: To be collected on day shift  -CMP: To be collected on day shift  -UDS: To be collected on day shift  -Urine pregnancy: To be collected on day shift  -Ethanol: To be collected on day shift  -Depakote level ordered due to patient being a poor historian regarding her medications. Patient's order for Depakote 250 mg ER p.o, daily at bedtime will need to be discontinued if patient's depakote level comes back elevated.   Patient's 12/07/2020 TSH, lipid panel, and hemoglobin A1c reviewed due to patient's history of taking antipsychotic medications.  12/07/2020 hemoglobin A1c elevated at 6.0% and prediabetic range.  12/07/2020 TSH within normal limits.  12/07/2020  lipid panel shows elevated cholesterol at 251 mg/dL, elevated triglycerides at 169 mg/dL, and elevated LDL cholesterol at 122 mg/dL.  Lipid panel otherwise unremarkable.  No repeat TSH, lipid panel, or hemoglobin A1c labs needed at this time.  Patient's 12/07/2020 EKG reviewed due to patient's history of taking antipsychotic medications, which shows QT/QTC of 404/460 ms.  QT/QTC is appropriate to continue antipsychotic medications.  Patient is a poor historian regarding her medications.  Difficult to determine patient's medication compliance or when she last took her medications or exactly what medications she is taking at this time.  Will restart/continue the following medications:  -Depakote ER 250 mg p.o. daily at bedtime for bipolar disorder, mood stability, and seizure coverage  -Vistaril 25 mg p.o. 3 times daily as needed for anxiety  -Zyprexa 5 mg p.o. daily at bedtime for bipolar disorder/psychosis  -Multivitamin with minerals tablet 1 tablet p.o. daily for nutritional supplementation  -Thiamine tablet 100 mg p.o. daily for nutritional supplementation  For potential development of alcohol withdrawal symptoms, will initiate CIWA protocol with the following:  -Ativan 1 mg p.o. every 6 hours as needed for CIWA greater than 10  -Imodium capsule 2 to 4 mg p.o. as needed for diarrhea or loose stools  -Zofran ODT 4 mg p.o. every 6 hours as needed for nausea/vomiting  Trazodone 50 mg p.o. at bedtime as needed ordered for sleep  Jaclyn ShaggyCody W Dorell Gatlin, PA-C 12/13/20  7:16 AM

## 2020-12-13 NOTE — Progress Notes (Signed)
CSW contacted the following programs regarding detox bed availablity with the following results:  [facility]: no bed availability  [facility]: no bed availability  CSW will follow up regarding referral made.  Vilma Meckel. Algis Greenhouse, MSW, LCSW, LCAS 12/13/2020 9:50 AM

## 2020-12-13 NOTE — ED Provider Notes (Addendum)
Myself and Duard [place], LMFT went out to [facility] lobby at [phone] to escort the patient from the [facility] lobby to the triage room from the [facility] lobby to begin/complete patient's triage assessment.  Upon attempting to escort the patient to the triage room from the lobby, patient stated that she wanted to leave [facility], refused to stay for an assessment and stated that she wanted to leave the facility. Myself and Duard [place], LMFT attempted multiple times to convince the patient to stay at [facility] for an assessment, but patient declined to stay for an assessment each time. Myself and Duard [place], LMFT then asked the patient to sign the MSE form.  Patient read over MSE form multiple times and refused to sign MSE form. Patient then left [facility] lobby without being assessed by myself or TTS.   Patient then re-entered [facility] lobby 5 minutes after leaving the [facility] lobby. Patient now states that she wants to be assessed. Duard [place], LMFT and myself to conduct patient's assessment. Patient completed MSE form upon reentry into [facility].

## 2020-12-13 NOTE — Progress Notes (Signed)
CSW contacted the following programs regarding detox bed availablity with the following results:  [facility]: no female bed availablity  [facility]: requested information be sent over  [facility]: no answer  [facility]): has bed availabilty.   CSW to follow up with pt regarding desires for detox.  Vilma Meckel. Algis Greenhouse, MSW, LCSW, LCAS 12/13/2020 8:51 AM

## 2020-12-13 NOTE — BH Assessment (Addendum)
Upon entering the lobby to request pt enter the triage room so PA-C Melbourne Abts and clinician could complete a MH Assessment, pt was in the bathroom. After several moments pt exited the bathroom and started walking towards the exit. Clinician called to pt and pt eventually stopped walking; pt stated she made a mistake and didn't want to be assessed. Clinician offered pt to have an assessment done, stating it would be completed as quickly as possible and then it could be determined what kind of help we can offer. Pt declined these offers. Clinician requested multiple times that pt sign the MSE form; pt read the form multiple times and stated she understood it but refused to sign it. Pt left the lobby.  Approximately 5 minutes later, pt re-entered the lobby and told Patient Access that she wanted to sign the paper. Pt's assessment can be found in a following note, documented at 0539.

## 2020-12-14 MED ORDER — THIAMINE HCL 100 MG PO TABS: 100.0000 mg | ORAL_TABLET | Freq: Every day | ORAL | 0 refills | Status: DC

## 2020-12-14 MED ORDER — DIVALPROEX SODIUM ER 250 MG PO TB24: 250.0000 mg | ORAL_TABLET | Freq: Every day | ORAL | 0 refills | Status: DC

## 2020-12-14 MED ORDER — HYDROXYZINE HCL 25 MG PO TABS: 25.0000 mg | ORAL_TABLET | Freq: Three times a day (TID) | ORAL | 0 refills | Status: AC | PRN

## 2020-12-14 MED ORDER — ADULT MULTIVITAMIN W/MINERALS CH: 1.0000 | ORAL_TABLET | Freq: Every day | ORAL | Status: DC

## 2020-12-14 MED ORDER — OLANZAPINE 5 MG PO TABS: 5.0000 mg | ORAL_TABLET | Freq: Every day | ORAL | 0 refills | Status: DC

## 2020-12-14 NOTE — ED Notes (Signed)
Pt sleeping@this time. Breathing even and unlabored. Will continue to monitor for safety 

## 2020-12-14 NOTE — Discharge Instructions (Addendum)
Patient is instructed prior to discharge to:  Take all medications as prescribed by his/her mental healthcare provider. Report any adverse effects and or reactions from the medicines to his/her outpatient provider promptly. Keep all scheduled appointments, to ensure that you are getting refills on time and to avoid any interruption in your medication.  If you are unable to keep an appointment call to reschedule.  Be sure to follow-up with resources and follow-up appointments provided.  Patient has been instructed & cautioned: To not engage in alcohol and or illegal drug use while on prescription medicines. In the event of worsening symptoms, patient is instructed to call the crisis hotline, 911 and or go to the nearest ED for appropriate evaluation and treatment of symptoms. To follow-up with his/her primary care provider for your other medical issues, concerns and or health care needs.    Substance Abuse Treatment Resources listed Below:  [facility] - Admissions are currently completed Monday through Friday at 8am; both appointments and walk-ins are accepted.  Any individual that is a [place] resident may present for a substance abuse screening and assessment for admission.  A person may be referred by numerous sources or self-refer.   Potential clients will be screened for medical necessity and appropriateness for the program.  Clients must meet criteria for high-intensity residential treatment services.  If clinically appropriate, a client will continue with the comprehensive clinical assessment and intake process, as well as enrollment in the [facility].  Address: [address] [place], [place] [address] Admin Hours: Mon-Fri 8AM to 5PM Center Hours: 24/7 Phone: [phone] Fax: [phone]  [facility] Address: [address], [place], [place] [address] Behavioral Health Urgent Care ([facility]) Hours: 24/7 Phone:  [phone] Fax: [phone]  [facility]): (offers outpatient therapy and intensive outpatient substance abuse therapy).  [address], [place], [place] [address] Phone: [phone]  [facility]: Offers FREE recovery skills classes, support groups, 1:1 Peer Support, and Compeer Classes. [address], [place], [place] [address] Phone: [phone] (Call to complete intake).   [facility] [address] [place], [place] [address] Phone: [phone] ext [phone] [facility] provides food, shelter and other programs and services to the homeless men of [place] through our men's program.  By offering safe shelter, three meals a day, clean clothing, Biblical counseling, financial planning, vocational training, GED/education and employment assistance, we've helped mend the shattered lives of many homeless men since opening in 1974.  We have approximately 267 beds available, with a max of 312 beds including mats for emergency situations and currently house an average of 270 men a night.  Prospective Client Check-In Information Photo ID Required (State/ Out of State/ DOC) - if photo ID is not available, clients are required to have a printout of a police/sheriff's criminal history report. Help out with chores around the Mission. No sex offender of any type (pending, charged, registered and/or any other sex related offenses) will be permitted to check in. Must be willing to abide by all rules, regulations, and policies established by the [facility]. The following will be provided - shelter, food, clothing, and biblical counseling. If you or someone you know is in need of assistance at our men's shelter in , , please call [phone] ext. [phone].  [facility]-will provide timely access to mental health services for children and adolescents (4-17) and adults    presenting in a mental health crisis. The program is designed for those who need urgent Behavioral Health or Substance Use treatment and are not experiencing a medical crisis that would typically require an emergency room visit.    [address] [place], [place] [address] Phone: [phone] [email or web address]  [facility]: Phone#: [phone]  The [facility] SA Intensive Outpatient Program (SAIOP) means structured individual and group addiction activities and services that are provided at an outpatient program designed to assist adult and adolescent consumers to begin recovery and learn skills for recovery maintenance. The [facility] SAIOP program is offered at least 3 hours a day, 3 days a week.SAIOP services shall include a structured program consisting of, but not limited to, the following services: Individual counseling and support; Group counseling and support; Family counseling, training or support; Biochemical assays to identify recent drug use (e.g., urine drug screens); Strategies for relapse prevention to include community and social support systems in treatment; Life skills; Crisis contingency planning; Disease Management; and Treatment support activities that have been adapted or specifically designed for persons with physical disabilities, or persons with co-occurring disorders of mental illness and substance abuse/dependence or mental retardation/developmental disability and substance abuse/dependence. Phone: [phone]  Address:   The [place] [facility] will also offer the following outpatient services: (Monday through Friday 8am-5pm)   Partial Hospitalization Program (PHP) Substance Abuse Intensive Outpatient Program (SA-IOP) Group Therapy Medication Management Peer Living Room We also provide (24/7):  Assessments: Our mental health clinician and providers will conduct a focused mental health evaluation, assessing for immediate  safety concerns and further mental health needs. Referral: Our team will provide resources and help connect to community based mental health treatment, when indicated, including psychotherapy, psychiatry, and other specialized behavioral health or substance use disorder services (for those not already in treatment). Transitional Care: Our team providers in person bridging and/or telephonic follow-up during the patient's transition to outpatient services.  [facility] 24-Hour Call Center: 1-800-256-2452 Behavioral Health Crisis Line: [phone]  

## 2020-12-14 NOTE — ED Notes (Signed)
Pt sleeping in no acute distress. RR even and unlabored. Safety maintained. 

## 2020-12-14 NOTE — ED Notes (Signed)
Pt sleeping@this time. breathing even and unlabored. Will continue to monitor for safety 

## 2020-12-14 NOTE — ED Notes (Signed)
Patient A&O x 4, ambulatory. Patient discharged in no acute distress. Patient denied SI/HI, A/VH upon discharge. Patient verbalized understanding of all discharge instructions explained by staff, to include follow up appointments, RX's and safety plan. Pt belongings returned to patient from locker #31 intact. Patient escorted to lobby via staff with bus pass in hand. Safety maintained.

## 2020-12-14 NOTE — ED Notes (Signed)
Pt given breakfast;cereal,2 nutrigrain bars, protein bars, graham crackers.

## 2020-12-14 NOTE — ED Notes (Signed)
Pt up eating at this time. Pt calm and cooperative. No c/o of pain or distress will continue to monitor for safety

## 2020-12-14 NOTE — ED Notes (Signed)
Pt denies SI/HI/AVH. Requesting to leave facility today. Calm, cooperative with staff during interview. Denies withdrawal sx. Safety maintained. Pt given shower supplies. Currently in shower. Safety maintained.

## 2020-12-14 NOTE — ED Provider Notes (Addendum)
FBC/OBS ASAP Discharge Summary  Date and Time: 12/14/2020 12:03 PM  Name: Sylvia Arellano  MRN:  [id number]   Discharge Diagnoses:  Final diagnoses:  Bipolar 1 disorder (HCC)  Alcohol abuse, daily use    Subjective: Patient states "I would like to be discharged, I am feeling better and I want to go home." Kinlie is insightful regarding treatment plan.  She reports a plan to follow-up with a 2-week substance use treatment stay however she would like to do this at a later time.  She is currently employed in [employer] states that "I love my job, I have to work out some things before I can go."  She states "I feel much better today."  Patient is assessed face-to-face by nurse practitioner.  She is seated in observation area, no acute distress. She is alert and oriented, pleasant and cooperative during assessment.  She reports euthymic mood with congruent affect. She denies suicidal and homicidal ideations.  She denies any history of suicide attempts, denies history of self-harm.  She contracts verbally for safety with this Clinical research associate.  She has normal speech and behavior.  She denies both auditory and visual hallucinations.  Patient is able to converse coherently with goal-directed thoughts and no distractibility or preoccupation.  He denies paranoia.  Objectively there is no evidence of psychosis/mania or delusional thinking.  Jasmyn reports she has a history of bipolar type II.  She has recently had difficulty refilling her medications, states she has been followed by outpatient psychiatry at [facility], [facility] for approximately 20 years.  Recently she did not follow-up for approximately 6 months, when she attempted to follow-up with [facility]- she was informed that [facility] would not be accepting new patients.  Social work at [employer] confirmed with [employer] that Myeisha can follow-up with outpatient counseling at [facility] but would not be seen for medication management at this time.   Discussed follow-up with outpatient psychiatry at [facility] behavioral health, patient verbalizes understanding.  She is currently homeless but plans to stay with a friend.  She denies access to weapons.  She is employed in the Product/process development scientist.  She endorses alcohol use, reports readiness to stop using alcohol.  She denies substance use aside from marijuana however urine drug screen positive for cocaine.  She endorses average sleep and appetite.  Patient offered support and encouragement.  She gives verbal consent to speak with a friend, Noralee Chars,  [phone] with whom she plans to reside. Spoke briefly with friend who denies concern for patient's safety.   Stay Summary:  HPI 12/13/2020 at 0655am: Sylvia Arellano is a 50 year old female with past psychiatric history significant for bipolar 1 disorder, bipolar 2 disorder, cocaine abuse/dependence, and alcohol abuse, and history of seizures (patient has reported history of nonalcohol withdrawal related seizures ),who presents to [facility] unaccompanied as a voluntary walk-in.  Patient reports that she is feeling hopeless and worthless.  Patient states that she would like help with her mental health.  Patient presented to [facility] on 12/07/2020 and was admitted to [facility] overnight continuous assessment on 12/07/2020.  Patient was then reevaluated by day shift treatment team on 12/08/2020.  Per chart review of 12/08/2020 reevaluation, patient declined the option of inpatient substance abuse treatment and stated that she would potentially be interested in engaging in outpatient substance abuse treatment at a later time.  Patient was provided with paper prescriptions for Zyprexa, Depakote, and hydroxyzine, as well as resources for outpatient substance abuse treatment and [facility] open access hours and was discharged from [facility] on 12/08/2020.  Per chart review, it appears that upon 12/08/20 [facility] discharge, patient received  prescriptions for thiamine 100 mg p.o. daily (30 tablets, 0 refills), multivitamin with minerals, Depakote ER 250 mg p.o. daily at bedtime (30 tablets, 0 refills), Zyprexa 5 mg p.o. daily at bedtime (30 tablets, 0 refills), and Vistaril 25 mg p.o. 3 times daily as needed (90 tablets, 0 refills).  Patient states that she was provided with 7-day samples of these prescriptions mentioned above and patient states that she has finished her 7-day samples of these prescriptions (Of note, it has not been 7 days since patient reportedly received her 7-day samples on 12/08/2020).  Patient states that upon discharge from [facility] on 12/08/20, patient took what she thought were paper prescriptions for the medications mentioned above to a [facility] and patient states that she was unable to fill these prescriptions there.  When patient is asked why she was not able to fill these prescriptions, patient becomes tangential and does not provide a logical answer for why she was not able to fill these prescriptions.  Patient does state that the pharmacist told her at the pharmacy that the documents she brought that the patient thought were paper prescriptions were not actually prescriptions.  Patient states that she does not have these paper prescriptions at this time.  Patient then takes a prescription bottle out of her purse (the bottle appears to be a 7-day sample bottle for thiamine that was provided to the patient at patient's last [facility] encounter), empties the bottle, and shows numerous pills in this bottle.  Patient states that she keeps all of her medications in this bottle.  I am unable to determine what these medications are upon examining these medications.  Patient states that she followed up with [facility] second-floor during open access hours multiple times since being discharged from [facility] on 12/08/2020.  However, per my chart review, I cannot find any records of the patient being evaluated at  [facility] second-floor.  Patient is a poor historian and is unable to provide accurate answers regarding when she last took any psychotropic medications since she was last at [facility].   Patient denies SI.  Patient states that she has not attempted suicide or engaged in self-injurious behavior via cutting since she was last at [facility] on 12/08/2020.  Patient denies HI.  Patient endorses auditory hallucinations on exam and endorses having auditory hallucinations "all my life", but patient does not provide further details when asked to provide further details regarding her auditory hallucinations.  Patient denies visual hallucinations.  Patient denies access to firearms.  When patient is asked about alcohol use, patient states that she drinks "more than I want to".  When patient is asked about frequency/quantity of alcohol use, patient does not provide an answer.  Patient states that her last alcohol use was on the evening of 12/12/2020.  When patient is asked how much alcohol she drank at that time, patient leans in and whispers that she drank one 6 pack of beer at that time.  Patient denies additional substance use.  Of note, patient's last UDS on 12/07/2020 was positive for cocaine and patient's last ethanol level on 12/07/2020 was elevated at 127 mg/dL.  Additionally, upon my previous assessment of the patient on 12/07/2020, patient initially stated that she was not using illicit substances and then stated that she was using cocaine.  Patient  denies history of alcohol withdrawal symptoms and patient denied this on my previous assessment on 12/07/2020 as well.  Patient reports that she has not had any seizures since she was last at [facility] on 12/08/20.    On exam, patient appears to be restless.  Patient is tearful on exam, but in no acute distress.  Speech is garbled and slurred.  Patient's mood is dysphoric and irritable with congruent affect.  Patient appears to be intoxicated on exam.  Patient  provides inappropriate answers to questions at times.  Patient appears to be manic, delusional, and tangential on exam, as well as experiencing some psychosis.  Difficult to determine if patient is responding to internal or external stimuli at this time.   Total Time spent with patient: 30 minutes  Past Psychiatric History: Bipolar 1 disorder, generalized anxiety disorder, personality disorder, cocaine use disorder, alcohol use disorder, bipolar 2 disorder Past Medical History:  Past Medical History:  Diagnosis Date   Bipolar 2 disorder (HCC)    Seizures (HCC)    Suicide attempt (HCC)    No past surgical history on file. Family History: No family history on file. Family Psychiatric History: None reported Social History:  Social History   Substance and Sexual Activity  Alcohol Use Yes   Comment: "twice a week"     Social History   Substance and Sexual Activity  Drug Use Yes   Types: Cocaine    Social History   Socioeconomic History   Marital status: Single    Spouse name: Not on file   Number of children: Not on file   Years of education: Not on file   Highest education level: Not on file  Occupational History   Not on file  Tobacco Use   Smoking status: Every Day    Packs/day: 0.50    Types: Cigarettes   Smokeless tobacco: Never  Vaping Use   Vaping Use: Never used  Substance and Sexual Activity   Alcohol use: Yes    Comment: "twice a week"   Drug use: Yes    Types: Cocaine   Sexual activity: Yes    Birth control/protection: Condom  Other Topics Concern   Not on file  Social History Narrative   Not on file   Social Determinants of Health   Financial Resource Strain: Not on file  Food Insecurity: Not on file  Transportation Needs: Not on file  Physical Activity: Not on file  Stress: Not on file  Social Connections: Not on file   SDOH:  SDOH Screenings   Alcohol Screen: Low Risk    Last Alcohol Screening Score (AUDIT): 5  Depression (PHQ2-9):  Medium Risk   PHQ-2 Score: 24  Financial Resource Strain: Not on file  Food Insecurity: Not on file  Housing: Not on file  Physical Activity: Not on file  Social Connections: Not on file  Stress: Not on file  Tobacco Use: High Risk   Smoking Tobacco Use: Every Day   Smokeless Tobacco Use: Never  Transportation Needs: Not on file    Tobacco Cessation:  A prescription for an FDA-approved tobacco cessation medication was offered at discharge and the patient refused  Current Medications:  Current Facility-Administered Medications  Medication Dose Route Frequency Provider Last Rate Last Admin   acetaminophen (TYLENOL) tablet 650 mg  650 mg Oral Q6H PRN Jaclyn Shaggy, PA-C   650 mg at 12/13/20 0833   alum & mag hydroxide-simeth (MAALOX/MYLANTA) 200-200-20 MG/5ML suspension 30 mL  30 mL Oral Q4H  PRN Melbourne Abts W, PA-C       chlordiazePOXIDE (LIBRIUM) capsule 25 mg  25 mg Oral Q6H PRN Rankin, Shuvon B, NP       chlordiazePOXIDE (LIBRIUM) capsule 25 mg  25 mg Oral TID Rankin, Shuvon B, NP   25 mg at 12/14/20 1000   Followed by   Melene Muller ON 12/15/2020] chlordiazePOXIDE (LIBRIUM) capsule 25 mg  25 mg Oral BH-qamhs Rankin, Shuvon B, NP       Followed by   Melene Muller ON 12/16/2020] chlordiazePOXIDE (LIBRIUM) capsule 25 mg  25 mg Oral Daily Rankin, Shuvon B, NP       divalproex (DEPAKOTE ER) 24 hr tablet 250 mg  250 mg Oral QHS Ladona Ridgel, Cody W, PA-C   250 mg at 12/13/20 2050   gabapentin (NEURONTIN) capsule 100 mg  100 mg Oral TID Rankin, Shuvon B, NP   100 mg at 12/14/20 1000   hydrocerin (EUCERIN) cream   Topical BID Jaclyn Shaggy, PA-C   Given at 12/14/20 1000   hydrOXYzine (ATARAX/VISTARIL) tablet 25 mg  25 mg Oral TID PRN Jaclyn Shaggy, PA-C   25 mg at 12/13/20 2049   loperamide (IMODIUM) capsule 2-4 mg  2-4 mg Oral PRN Melbourne Abts W, PA-C       magnesium hydroxide (MILK OF MAGNESIA) suspension 30 mL  30 mL Oral Daily PRN Melbourne Abts W, PA-C       multivitamin with minerals tablet 1 tablet  1  tablet Oral Daily Rankin, Shuvon B, NP   1 tablet at 12/14/20 1000   OLANZapine (ZYPREXA) tablet 5 mg  5 mg Oral QHS Melbourne Abts W, PA-C   5 mg at 12/13/20 2050   ondansetron (ZOFRAN-ODT) disintegrating tablet 4 mg  4 mg Oral Q6H PRN Melbourne Abts W, PA-C       thiamine tablet 100 mg  100 mg Oral Daily Rankin, Shuvon B, NP   100 mg at 12/14/20 1000   traZODone (DESYREL) tablet 50 mg  50 mg Oral QHS PRN Jaclyn Shaggy, PA-C   50 mg at 12/13/20 2049   Current Outpatient Medications  Medication Sig Dispense Refill   divalproex (DEPAKOTE ER) 250 MG 24 hr tablet Take 1 tablet (250 mg total) by mouth at bedtime. 30 tablet 0   hydrOXYzine (ATARAX/VISTARIL) 25 MG tablet Take 1 tablet (25 mg total) by mouth 3 (three) times daily as needed for anxiety. 90 tablet 0   Multiple Vitamin (MULTIVITAMIN WITH MINERALS) TABS tablet Take 1 tablet by mouth daily.     OLANZapine (ZYPREXA) 5 MG tablet Take 1 tablet (5 mg total) by mouth at bedtime. 30 tablet 0   thiamine 100 MG tablet Take 1 tablet (100 mg total) by mouth daily. 30 tablet 0    PTA Medications: (Not in a hospital admission)   Musculoskeletal  Strength & Muscle Tone: within normal limits Gait & Station: normal Patient leans: N/A  Psychiatric Specialty Exam  Presentation  General Appearance: Appropriate for Environment; Casual  Eye Contact:Good  Speech:Clear and Coherent; Normal Rate  Speech Volume:Normal  Handedness:Right   Mood and Affect  Mood:Euthymic  Affect:Appropriate; Congruent   Thought Process  Thought Processes:Coherent; Goal Directed  Descriptions of Associations:Intact  Orientation:Full (Time, Place and Person)  Thought Content:Logical  Diagnosis of Schizophrenia or Schizoaffective disorder in past: No    Hallucinations:Hallucinations: None Description of Auditory Hallucinations: See HPI for details.  Ideas of Reference:None  Suicidal Thoughts:Suicidal Thoughts: No  Homicidal Thoughts:Homicidal Thoughts:  No  Sensorium  Memory:Immediate Good; Recent Fair; Remote Fair  Judgment:Fair  Insight:Fair   Executive Functions  Concentration:Good  Attention Span:Good  Recall:Good  Fund of Knowledge:Good  Language:Good   Psychomotor Activity  Psychomotor Activity:Psychomotor Activity: Normal   Assets  Assets:Communication Skills; Desire for Improvement; Financial Resources/Insurance; Intimacy; Leisure Time; Physical Health; Social Support; Resilience   Sleep  Sleep:Sleep: Fair   Nutritional Assessment (For OBS and FBC admissions only) Has the patient had a weight loss or gain of 10 pounds or more in the last 3 months?: No Has the patient had a decrease in food intake/or appetite?: Yes Does the patient have dental problems?: No Does the patient have eating habits or behaviors that may be indicators of an eating disorder including binging or inducing vomiting?: No Has the patient recently lost weight without trying?: No Has the patient been eating poorly because of a decreased appetite?: Yes Malnutrition Screening Tool Score: 1    Physical Exam  Physical Exam Vitals and nursing note reviewed.  Constitutional:      Appearance: Normal appearance. She is well-developed and normal weight.  HENT:     Head: Normocephalic and atraumatic.     Nose: Nose normal.  Cardiovascular:     Rate and Rhythm: Normal rate.  Pulmonary:     Effort: Pulmonary effort is normal.  Musculoskeletal:        General: Normal range of motion.     Cervical back: Normal range of motion.  Skin:    General: Skin is warm and dry.  Neurological:     Mental Status: She is alert and oriented to person, place, and time.  Psychiatric:        Attention and Perception: Attention and perception normal.        Mood and Affect: Mood and affect normal.        Speech: Speech normal.        Behavior: Behavior normal. Behavior is cooperative.        Thought Content: Thought content normal.        Cognition  and Memory: Cognition and memory normal.        Judgment: Judgment normal.   Review of Systems  Constitutional: Negative.   HENT: Negative.    Eyes: Negative.   Respiratory: Negative.    Cardiovascular: Negative.   Gastrointestinal: Negative.   Genitourinary: Negative.   Musculoskeletal: Negative.   Skin: Negative.   Neurological: Negative.   Endo/Heme/Allergies: Negative.   Psychiatric/Behavioral:  Positive for substance abuse.   Blood pressure (!) 119/98, pulse 65, temperature (!) 97.1 F (36.2 C), temperature source Temporal, resp. rate 16, SpO2 100 %. There is no height or weight on file to calculate BMI.  Demographic Factors:  NA  Loss Factors: NA  Historical Factors: NA  Risk Reduction Factors:   Employed, Living with another person, especially a relative, Positive social support, Positive therapeutic relationship, and Positive coping skills or problem solving skills  Continued Clinical Symptoms:  Alcohol/Substance Abuse/Dependencies  Cognitive Features That Contribute To Risk:  None    Suicide Risk:  Minimal: No identifiable suicidal ideation.  Patients presenting with no risk factors but with morbid ruminations; may be classified as minimal risk based on the severity of the depressive symptoms  Plan Of Care/Follow-up recommendations:  Other:  Patient reviewed with Dr Bronwen Betters. Current medications include: -Depakote ER 250 mg nightly -Hydroxyzine 25 mg 3 times daily as needed/anxiety -Multivitamin 1 tab daily -Olanzapine 5 mg nightly -Thiamine 100 mg daily Follow[facility] psychiat[facility]. Follow-up with outpatient psychiatry for counseling at [facility]. Follow-up with substance use treatment resources provided.  Disposition: Discharge  Lenard Lanceina L Jansen Goodpasture, FNP 12/14/2020, 12:03 PM

## 2020-12-14 NOTE — Progress Notes (Signed)
CSW contacted [facility] and was informed that they are no longer taking new patient for medication management but only for therapy.   Vilma Meckel. Algis Greenhouse, MSW, LCSW, LCAS 12/14/2020 12:23 PM

## 2020-12-16 LAB — VALPROIC ACID LEVEL: Valproic Acid Lvl: 16 ug/mL — ABNORMAL LOW (ref 50.0–100.0)

## 2021-03-05 ENCOUNTER — Other Ambulatory Visit: Payer: Self-pay

## 2021-03-05 ENCOUNTER — Ambulatory Visit (HOSPITAL_COMMUNITY)
Admission: EM | Admit: 2021-03-05 | Discharge: 2021-03-05 | Disposition: A | Discharge status: Home / Self Care | Payer: No Payment, Other | Attending: Behavioral Health | Admitting: Behavioral Health

## 2021-03-05 DIAGNOSIS — F330 Major depressive disorder, recurrent, mild: Secondary | ICD-10-CM | POA: Insufficient documentation

## 2021-03-05 DIAGNOSIS — F19982 Other psychoactive substance use, unspecified with psychoactive substance-induced sleep disorder: Secondary | ICD-10-CM

## 2021-03-05 DIAGNOSIS — F1490 Cocaine use, unspecified, uncomplicated: Secondary | ICD-10-CM | POA: Insufficient documentation

## 2021-03-05 DIAGNOSIS — G4700 Insomnia, unspecified: Secondary | ICD-10-CM | POA: Insufficient documentation

## 2021-03-05 MED ORDER — TRAZODONE HCL 50 MG PO TABS: 50.0000 mg | ORAL_TABLET | Freq: Every evening | ORAL | 0 refills | Status: DC | PRN

## 2021-03-05 NOTE — ED Provider Notes (Addendum)
Behavioral Health Urgent Care Medical Screening Exam  Patient Name: Sylvia Arellano MRN: [id number] Date of Evaluation: 03/05/21 Diagnosis:  Final diagnoses:  Mild episode of recurrent major depressive disorder (HCC)  Drug-induced insomnia (HCC)    History of Present illness: Sylvia Arellano is a 50 y.o. female patient who presents to the [facility] voluntarily as a walk-in unaccompanied by with a chief complaint of "requesting medications."   Patient seen and examined face to face by this provider and chart reviewed. On evaluation, patient is alert and oriented x4. Her thought process is logical and speech is coherent. Her mood is dysphoric and affect is congruent. On approach, she hands this provider a list of medications that she is requesting to have filled. She is requesting gabapentin 300 mg, trazodone 100 mg, and thiamine. She states that she has been off the stated medications since the last time she was here and stayed a couple nights.  Per chart review, the patient was admitted to the [facility] and prescribed Depakote 250 mg QHS, Vistaril 25 mg 3 times daily as needed and olanzapine 5 mg nightly. She states that she does not have current psychiatrist or therapist.  She reports feeling depressed and describes her symptoms as crying all the time, sadness, worthlessness, hopelessness, and poor sleep. She reports feeling depressed since she was 50 years old. She states that she is always depressed. She reports poor sleep at night and states that she wakes up too many times  throughout the night. She reports having a poor appetite. She denies having thoughts of wanting to hurt herself or others. She denies auditory and visual hallucinations. She states that she is not using cocaine right now but last used $20 worth cocaine 2 or 3 days ago. She reports using cocaine off and on for the past 2 years. She requested a note to return back to work on Tuesday due to feeling  depressed and not being able to sleep at night.   I discussed restarting trazodone at 50 mg po nightly for depression and sleep. I advised the patient to follow up at the [facility] with psychiatry for medication management and therapy. Patient verbalizes understanding to the stated treatment plan.  Psychiatric Specialty Exam  Presentation  General Appearance:Appropriate for Environment  Eye Contact:Fair  Speech:Clear and Coherent  Speech Volume:Normal  Handedness:Right   Mood and Affect  Mood:Dysphoric  Affect:Congruent   Thought Process  Thought Processes:Coherent; Goal Directed  Descriptions of Associations:Intact  Orientation:Full (Time, Place and Person)  Thought Content:Logical  Diagnosis of Schizophrenia or Schizoaffective disorder in past: No   Hallucinations:None See HPI for details.  Ideas of Reference:None  Suicidal Thoughts:No Without Intent; Without Plan  Homicidal Thoughts:No   Sensorium  Memory:Immediate Fair; Recent Fair; Remote Fair  Judgment:Fair  Insight:Fair   Executive Functions  Concentration:Fair  Attention Span:Fair  Recall:Fair  Fund of Knowledge:Fair  Language:Fair   Psychomotor Activity  Psychomotor Activity:Normal   Assets  Assets:Communication Skills; Desire for Improvement; Financial Resources/Insurance; Housing; Leisure Time; Physical Health; Social Lawyer; Talents/Skills   Sleep  Sleep:Poor  Number of hours: 1   No data recorded  Physical Exam: Physical Exam Constitutional:      Appearance: Normal appearance.  HENT:     Head: Atraumatic.     Nose: Nose normal.  Eyes:     Conjunctiva/sclera: Conjunctivae normal.  Cardiovascular:     Rate and Rhythm: Normal rate.  Pulmonary:     Effort:  Pulmonary effort is normal.  Musculoskeletal:        General: Normal range of motion.     Cervical back: Normal range of motion.  Neurological:     Mental  Status: She is alert and oriented to person, place, and time.   Review of Systems  Constitutional: Negative.   HENT: Negative.    Eyes: Negative.   Respiratory: Negative.    Cardiovascular: Negative.   Gastrointestinal: Negative.   Genitourinary: Negative.   Musculoskeletal: Negative.   Skin: Negative.   Neurological: Negative.   Endo/Heme/Allergies: Negative.   Psychiatric/Behavioral:  Positive for depression and substance abuse.   Blood pressure (!) 114/91, pulse 73, temperature 98.3 F (36.8 C), temperature source Oral, resp. rate 18, height 5\' 2"  (1.575 m), weight 120 lb (54.4 kg), SpO2 100 %. Body mass index is 21.95 kg/m.  Musculoskeletal: Strength & Muscle Tone: within normal limits Gait & Station: normal Patient leans: N/A   [facility] MSE Discharge Disposition for Follow up and Recommendations: Based on my evaluation the patient does not appear to have an emergency medical condition and can be discharged with resources and follow up care in outpatient services for Medication Management, Substance Abuse Intensive Outpatient Program, Individual Therapy, and Group Therapy   Follow-up Information     Go to  [facility].   Specialty: Urgent Care Why: Open access walk-in hours Monday - Thursday 8 am to 11 am for medication management and therapy. Contact information: [address] [place] [phone]                 [phone], NP 03/05/2021, 4:24 PM

## 2021-03-05 NOTE — ED Notes (Signed)
Discharge instructions provided and Pt stated understanding. Pt alert, orient and ambulatory prior to d/c from facility. Safety maintained.   

## 2021-03-05 NOTE — Discharge Instructions (Signed)

## 2022-10-02 ENCOUNTER — Encounter (HOSPITAL_COMMUNITY): Payer: Self-pay | Admitting: Registered Nurse

## 2022-10-02 ENCOUNTER — Ambulatory Visit (HOSPITAL_COMMUNITY)
Admission: EM | Admit: 2022-10-02 | Discharge: 2022-10-02 | Disposition: A | Discharge status: Home / Self Care | Payer: Medicaid Other | Attending: Registered Nurse | Admitting: Registered Nurse

## 2022-10-02 DIAGNOSIS — F411 Generalized anxiety disorder: Secondary | ICD-10-CM

## 2022-10-02 DIAGNOSIS — F319 Bipolar disorder, unspecified: Secondary | ICD-10-CM | POA: Insufficient documentation

## 2022-10-02 DIAGNOSIS — Z9151 Personal history of suicidal behavior: Secondary | ICD-10-CM | POA: Insufficient documentation

## 2022-10-02 HISTORY — DX: Generalized anxiety disorder: F41.1

## 2022-10-02 NOTE — Progress Notes (Signed)
   10/02/22 1431  [facility] Triage Screening (Walk-ins at [facility] only)  How Did You Hear About [place]? Self  What Is the Reason for Your Visit/Call Today? Pt presents to [facility] voluntarily due to worsening depression symptoms for the past 2 months. Pt reports being diagnosed with Bipolar II disorder, she has been without medication for about 1 year. Pt reports stress job, crying spells, lack of sleep. Pt reports interest in therapy and psychiatry services.Pt denies SI/HI and AVH.  How Long Has This Been Causing You Problems? 1-6 months  Have You Recently Had Any Thoughts About Hurting Yourself? No  Are You Planning to Commit Suicide/Harm Yourself At This time? No  Have you Recently Had Thoughts About Hurting Someone Karolee Ohs? No  Are You Planning To Harm Someone At This Time? No  Are you currently experiencing any auditory, visual or other hallucinations? No  Have You Used Any Alcohol or Drugs in the Past 24 Hours? No  Do you have any current medical co-morbidities that require immediate attention? No  Clinician description of patient physical appearance/behavior: calm, cooperative, well groomed  What Do You Feel Would Help You the Most Today? Treatment for Depression or other mood problem  If access to [facility] was not available, would you have sought care in the Emergency Department? No  Determination of Need Routine (7 days)  Options For Referral Outpatient Therapy;Medication Management

## 2022-10-02 NOTE — Discharge Instructions (Addendum)
  [facility]: Outpatient psychiatric Services:   Please see the walk in hours listed below.  Medication Management New Patient needing Medication Management Walk-in, and Existing Patients needing to see a provider for management coming as a walk in   Monday thru Friday 8:00 AM first come first serve until slots are full.  Recommend being there by 7:15 AM to ensure a slot is open.  Therapy New Patient Therapy Intake and Existing Patients needing to see therapist coming in as a walk in.   Monday, Wednesday, and Thursday morning at 8:00 am first come first serve.  Recommend being there by 7:15 AM to ensure a slot is open.    Every 1st, 2nd, and 3rd Friday at 1:00 PM first come first serve until slots are full.  Will still need to come in that morning at 7:15 AM to get registered for an afternoon slot.  For all walk-ins we ask that you arrive by 7:15 am because patients will be seen in there order of arrival (FIRST COME FIRST SERVE) Availability is limited, therefore you may not be seen on the same day that you walk in if all slots are full.    Our goal is to serve and meet the needs of our community to the best of our ability.    [facility] Address: [address] [place], [place], [place] [address] Phone: [phone]  Supported Employment The supported employment program is a person-centered, individualized, evidence-based support service that helps members choose, acquire, and maintain competitive employment in our community. This service supports the varying needs of individuals and promotes community inclusion and employment success. Members enrolled in the supported employment program can expect the following:  Development of an individual career plan Community based job placement Job shadowing Job development On-site job Furniture conservator/restorer and support  Supported Education Supported education helps our members receive the education and training  they need to achieve their learning and recovery goals. This will assist members with becoming gainfully employed in the job or career of their choice. The program includes assistance with: Registering for disability accommodations Enrolling in school and registering for classes Learning communication skills Scheduling tutoring sessions within your school [facility] partners with Vocational Rehabilitation to help increase the success of clients seeking employment and educational goals.  Want to learn more about our programs?   Please contact our intake department INTAKE: [phone] Ext [phone]  Mailing: [address] [address]   [place], [place] [address]   [email or web address]
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
## 2022-10-07 IMAGING — CT CT HEAD W/O CM
4 series · 16 of 47 positions shown, 18 images · non-contrast
Comparison: MRI 07/09/2014

CLINICAL DATA: Seizure

EXAM:
CT HEAD WITHOUT CONTRAST
TECHNIQUE: Contiguous axial images were obtained from the base of the skull
through the vertex without intravenous contrast.

[Series 3: head without · axial · non-contrast · 0.42mm/px · z∈[-76,+19]mm · 7 of 27 slices shown, 9 images]
[im 4/27  brain]
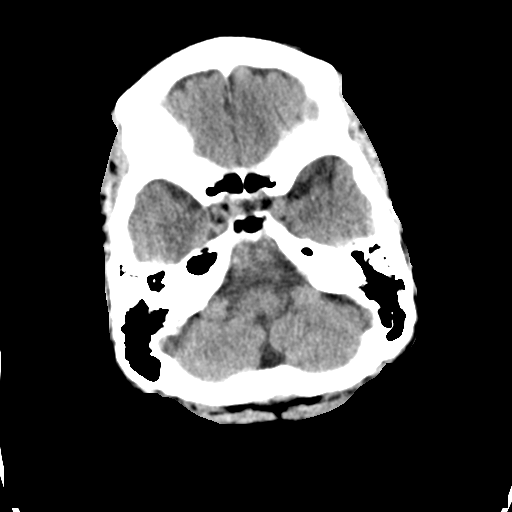
[im 4/27  bone]
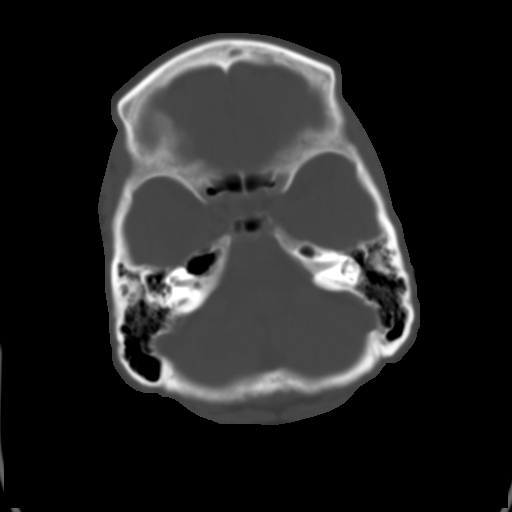
[im 7/27  brain]
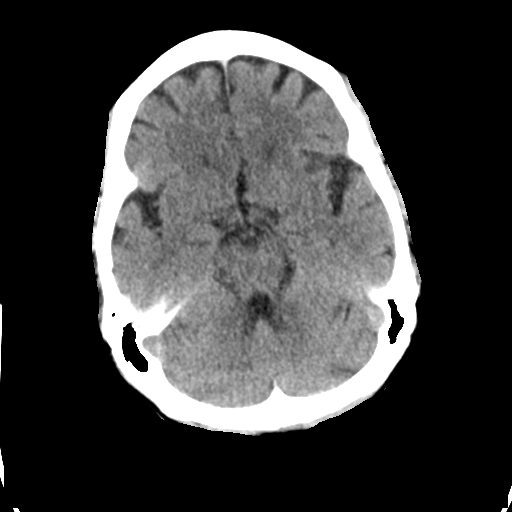
[im 10/27  brain]
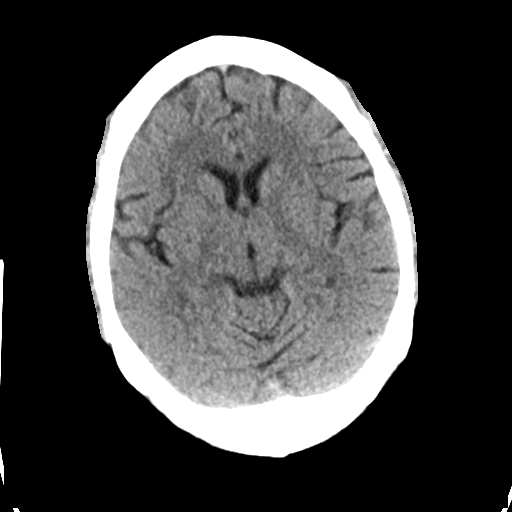
[im 14/27  brain]
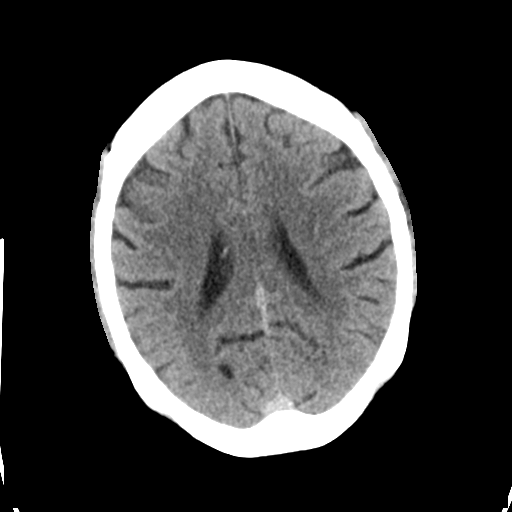
[im 17/27  brain]
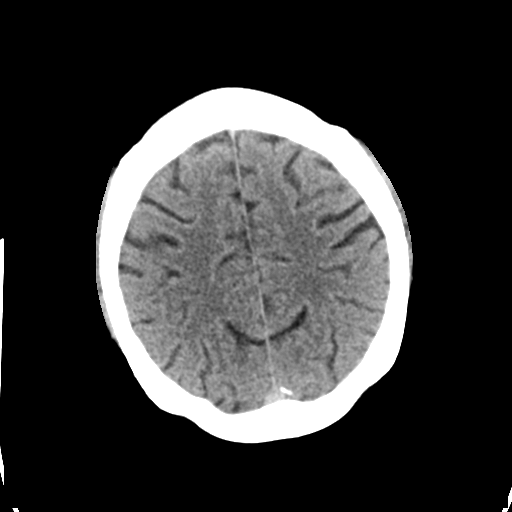
[im 17/27  bone]
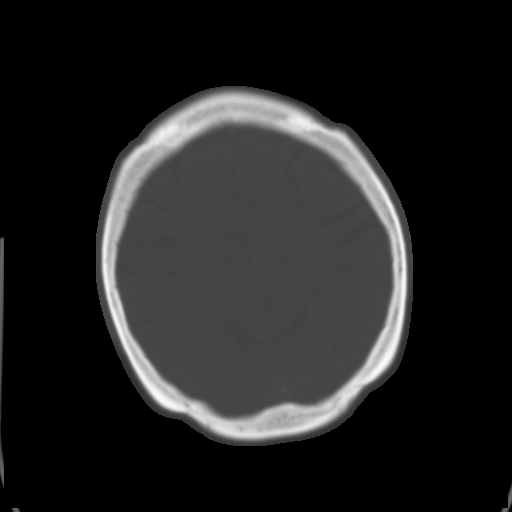
[im 20/27  brain]
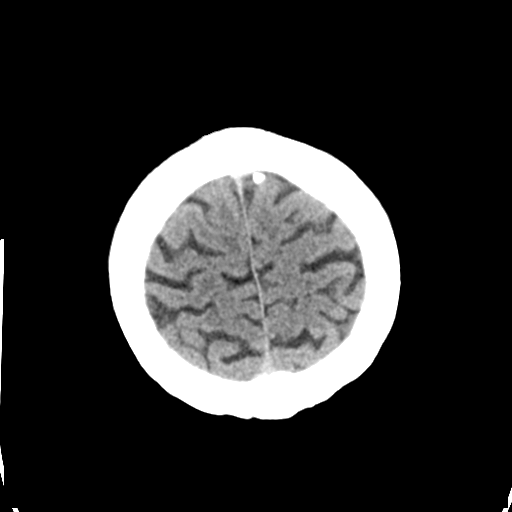
[im 23/27  brain]
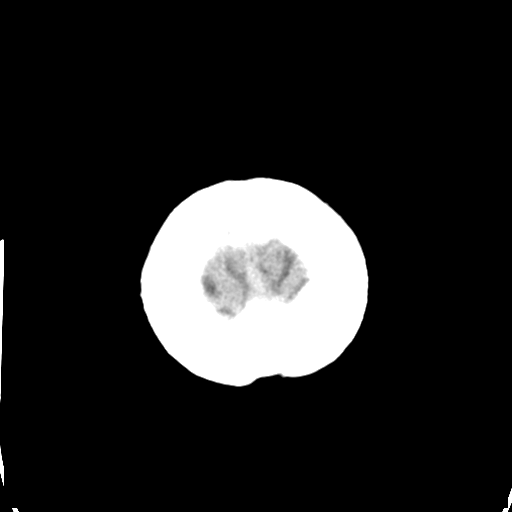

[Series 4: head bone · axial · 0.42mm/px · z∈[-79,-53]mm · 3 of 66 slices shown]
[im 7/66  bone]
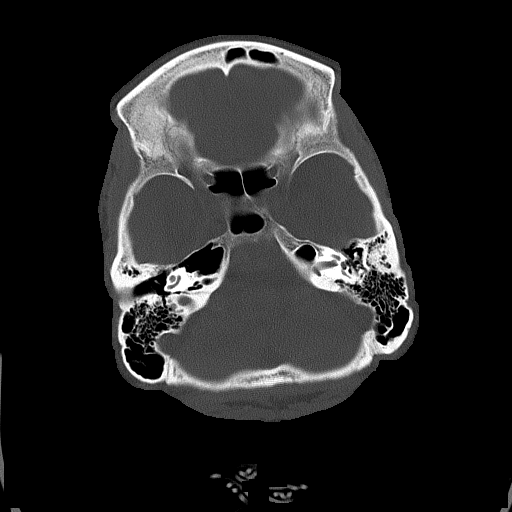
[im 14/66  bone]
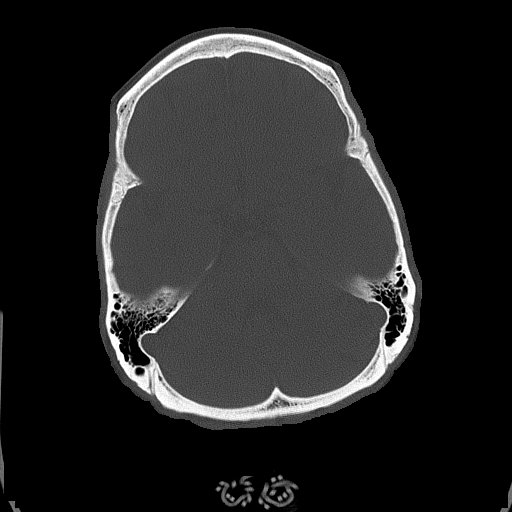
[im 20/66  bone]
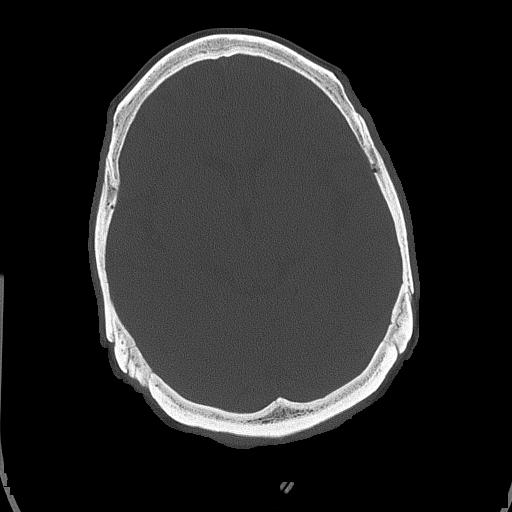

[Series 5: head without cor · coronal · non-contrast · 0.29mm/px · 3 of 67 slices shown]
[im 23/67  brain]
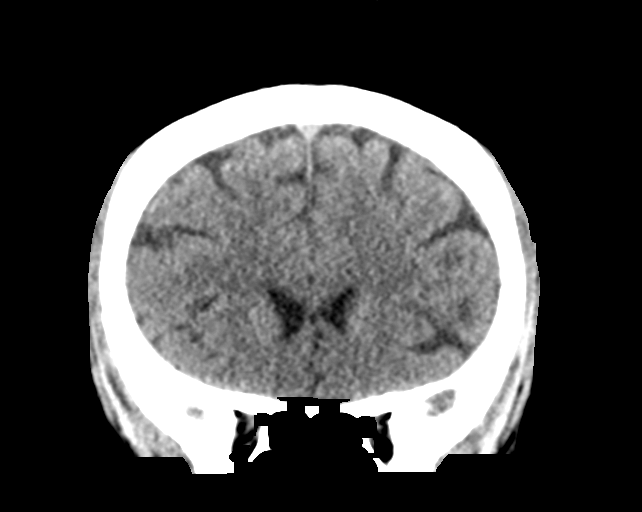
[im 30/67  brain]
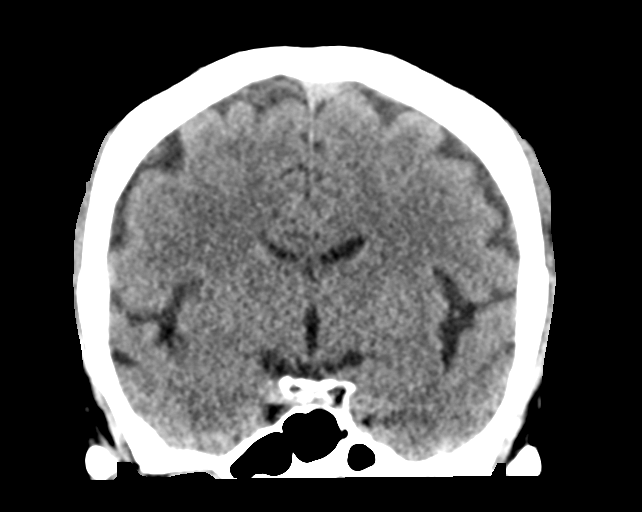
[im 37/67  brain]
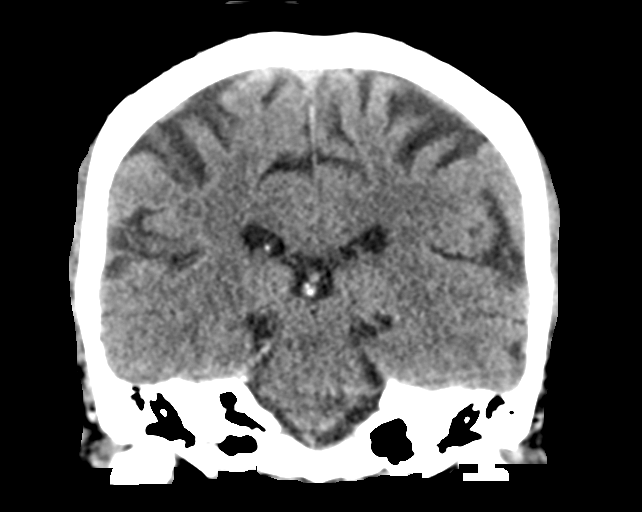

[Series 6: head without sag · sagittal · non-contrast · 0.29mm/px · 3 of 62 slices shown]
[im 21/62  brain]
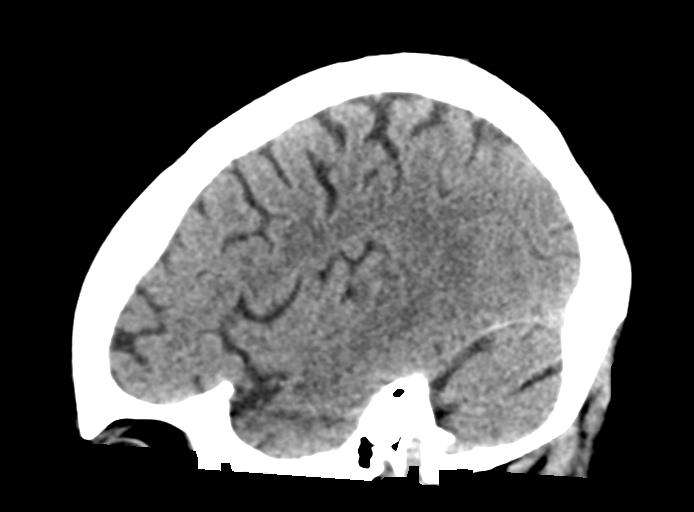
[im 31/62  brain]
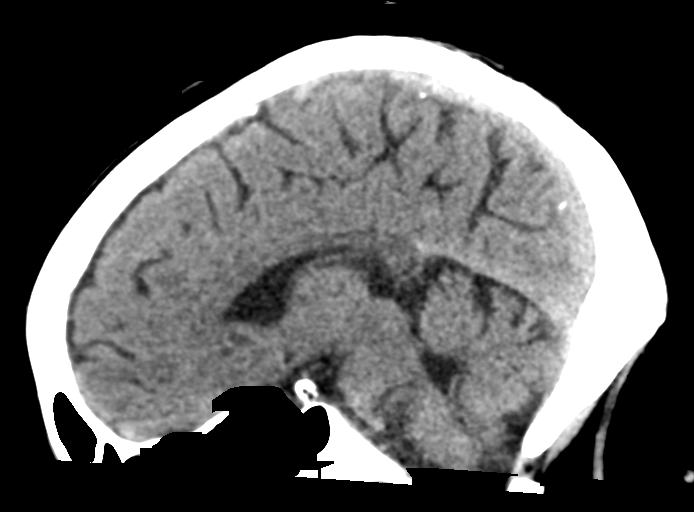
[im 41/62  brain]
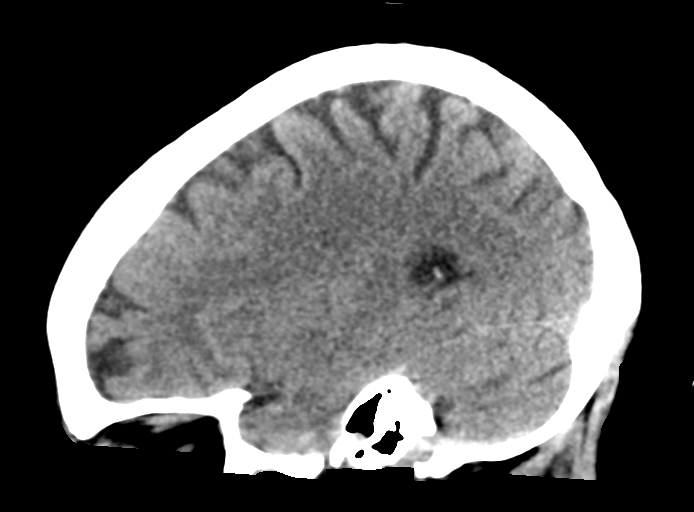

[16 of 47 positions shown; findings below may reference images not displayed]

FINDINGS: Brain: Normal anatomic configuration. No abnormal intra or
extra-axial mass lesion or fluid collection. No abnormal mass effect
or midline shift. No evidence of acute intracranial hemorrhage or
infarct. Ventricular size is normal. Cerebellum unremarkable.

Vascular: Unremarkable

Skull: Intact

Sinuses/Orbits: Paranasal sinuses are clear. Orbits are
unremarkable.

Other: Mastoid air cells and middle ear cavities are clear.
IMPRESSION: Normal examination.  No acute intracranial abnormality.

## 2023-09-20 ENCOUNTER — Ambulatory Visit (HOSPITAL_COMMUNITY)
Admission: EM | Admit: 2023-09-20 | Discharge: 2023-09-20 | Disposition: A | Discharge status: Home / Self Care | Payer: MEDICAID | Attending: Family Medicine | Admitting: Family Medicine

## 2023-09-20 DIAGNOSIS — F32A Depression, unspecified: Secondary | ICD-10-CM | POA: Insufficient documentation

## 2023-09-20 DIAGNOSIS — F411 Generalized anxiety disorder: Secondary | ICD-10-CM | POA: Insufficient documentation

## 2023-09-20 DIAGNOSIS — Z9151 Personal history of suicidal behavior: Secondary | ICD-10-CM | POA: Insufficient documentation

## 2023-09-20 DIAGNOSIS — Z79899 Other long term (current) drug therapy: Secondary | ICD-10-CM | POA: Insufficient documentation

## 2023-09-20 MED ORDER — HYDROXYZINE HCL 25 MG PO TABS: 25.0000 mg | ORAL_TABLET | Freq: Four times a day (QID) | ORAL | 0 refills | Status: DC | PRN
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
## 2023-09-20 NOTE — ED Provider Notes (Signed)
 Behavioral Health Urgent Care Medical Screening Exam  Patient Name: Sylvia Arellano MRN: [id number] Date of Evaluation: 09/20/23 Chief Complaint: "Wanted to get back on my medication"  Diagnosis:  Final diagnoses:  Generalized anxiety disorder    History of Present illness: Per triage, Sylvia Arellano is a 53 y.o. female presents to [facility] voluntarily unaccompanied. Pt states that she has been feeling depressed. Pt states that she would like to get started back on her medication. Pt states that she can't remember the last time she took her medication. Pt currently denies SI, HI, AVH and alcohol/drug use.   Chart reviewed with attending psychiatrist, Dr Kathlen Para.   Sylvia Arellano is seen face-to-face at [facility] for mental health evaluation. Sylvia Arellano states "been really depressed and wanted to get back on my medication." States she was previously prescribed Trazodone  and Prozac . States "I can't remember" the last time she was prescribed medication; "been a long time." States she has been seen at [facility] in the past as well as [facility], but cannot recall the last time she was seen. Chart review indicates Sylvia Arellano was hospitalized at [facility] in July, 2022 and was discharged on Depakote , Hydroxyzine , and olanzapine . She was seen at [facility] in October, 2022 requesting psych meds and was Rx'd Trazodone , and last seen May, 2024 at [facility] and was referred to outpatient resources. States she has not been on medication "because I started feeling better." Today, she rates depression "11-12"/10 and anxiety "10"/10 with 10 being worse. She also states her depression and anxiety are triggered by the possibility of having to take legal action against her brother due to taking her inheritance from her mother after her mother passed away.   States mood has been "awful" for the past 2 weeks. Triggered by "my job" States she works 12 hours at [employer] and stating her boss has said "that I can't eat at work any more."   Psych History: Hosp:  [facility], [facility], [facility], [facility], [facility] Med trials: Trazodone , Prozac , clonazepam, Zoloft and hydroxyzine  Suicide attempts: states one previous attempt 20 years ago via "I cut my wrist"  Substance Use Caffeine: 5 glasses of tea/day Tobacco: states quit 4 years ago Denies alcohol, marijuana, heroin, cocaine, fentanyl, and methamphetamine   Flowsheet Row ED from 09/20/2023 in [facility] ED from 10/02/2022 in [facility] ED from 12/13/2020 in [facility]  C-SSRS RISK CATEGORY No Risk No Risk No Risk       Psychiatric Specialty Exam  Presentation  General Appearance:Appropriate for Environment; Casual; Neat  Eye Contact:Good  Speech:Clear and Coherent; Normal Rate  Speech Volume:Normal  Handedness:Right   Mood and Affect  Mood: Anxious; Dysphoric  Affect: Congruent; Tearful   Thought Process  Thought Processes: Coherent; Goal Directed  Descriptions of Associations:Intact  Orientation:Full (Time, Place and Person)  Thought Content:Logical  Diagnosis of Schizophrenia or Schizoaffective disorder in past: No data recorded  Hallucinations:None  Ideas of Reference:None  Suicidal Thoughts:No  Homicidal Thoughts:No   Sensorium  Memory: Immediate Good; Recent Good  Judgment: Intact  Insight: Present; Good   Executive Functions  Concentration: Good  Attention Span: Good  Recall: Good  Fund of Knowledge: Good  Language: Good   Psychomotor Activity  Psychomotor Activity: Normal   Assets  Assets: Communication Skills; Desire for Improvement; Housing; Physical Health; Resilience; Social Support   Sleep  Sleep: Good  Number of hours: No data recorded  Physical Exam: Physical Exam Vitals and nursing note  reviewed.  Constitutional:      Appearance: Normal appearance.  HENT:     Head: Normocephalic.     Mouth/Throat:     Mouth: Mucous  membranes are moist.  Cardiovascular:     Rate and Rhythm: Normal rate.  Pulmonary:     Effort: Pulmonary effort is normal.  Musculoskeletal:        General: Normal range of motion.     Cervical back: Normal range of motion.  Skin:    General: Skin is warm and dry.  Neurological:     Mental Status: She is alert and oriented to person, place, and time.  Psychiatric:        Behavior: Behavior normal.        Judgment: Judgment normal.    Review of Systems  Constitutional:  Negative for fever.  Respiratory:  Negative for shortness of breath.   Cardiovascular:  Negative for chest pain.  Gastrointestinal:  Negative for diarrhea, nausea and vomiting.  Psychiatric/Behavioral:  Positive for depression. Negative for substance abuse and suicidal ideas. The patient is nervous/anxious.    Blood pressure (!) 130/90, pulse 65, temperature 98.5 F (36.9 C), temperature source Oral, resp. rate 16, SpO2 94%. There is no height or weight on file to calculate BMI.  Musculoskeletal: Strength & Muscle Tone: within normal limits Gait & Station: normal Patient leans: N/A   [facility] MSE Discharge Disposition for Follow up and Recommendations: Based on my evaluation the patient does not appear to have an emergency medical condition and can be discharged with resources and follow up care in outpatient services for Medication Management and Individual Therapy  Hydroxyzine  25 mg PO every 6 hours as needed for anxiety electronically prescribed.  The patient has been provided with outpatient mental health resources and these resources were discussed with the patient. She is advised of the importance of following up with outpatient mental health provider; she verbalizes understanding. The patient is in agreement with discharge plan, demonstrates insight into her condition, and agrees to follow up for outpatient care. There are no acute safety concerns or active suicidal ideation present at the time of discharge.     Sylvia Arellano, PMHNP-BC, FNP-BC  09/20/2023, 2:58 PM

## 2023-09-20 NOTE — Discharge Instructions (Addendum)
 Discharge Recommendations  Medications Patient is to take medications as prescribed. The patient or patient's guardian is to contact a medical professional and/or outpatient provider to address any new side effects that develop. The patient or the patient's guardian should update outpatient providers of any new medications and/or medication changes.   Hydroxyzine  25 mg has been sent to your pharmacy to help with anxiety. Take as directed. It is VERY important to follow up with outpatient provider to continue your mental health medications.    Outpatient Follow up: Please review list of outpatient resources for psychiatry and counseling. Please follow up with your primary care provider for all medical related needs.    Therapy: We recommend that patient participate in individual therapy to address mental health concerns.   Safety  The following safety precautions should be taken:   No sharp objects. This includes scissors, razors, scrapers, and putty knives.   Chemicals should be removed and locked up.   Medications should be removed and locked up.   Weapons should be removed and locked up. This includes firearms, knives and instruments that can be used to cause injury.   The patient should abstain from use of illicit substances/drugs and abuse of any medications.  If symptoms worsen or do not continue to improve or if the patient becomes actively suicidal or homicidal then it is recommended that the patient return to the closest hospital emergency department, the [facility], or call 911 for further evaluation and treatment.  National Suicide Prevention Lifeline 1-800-SUICIDE or (321)018-0564.  About 988 988 offers 24/7 access to trained crisis counselors who can help people experiencing mental health-related distress. People can call or text 988 or chat 988lifeline.org for themselves or if they are worried about a loved one who may need crisis support.

## 2023-09-26 ENCOUNTER — Ambulatory Visit (INDEPENDENT_AMBULATORY_CARE_PROVIDER_SITE_OTHER): Payer: MEDICAID | Admitting: Student

## 2023-09-26 ENCOUNTER — Encounter (HOSPITAL_COMMUNITY): Payer: Self-pay | Admitting: Student

## 2023-09-26 VITALS — BP 117/89 | HR 71 | Ht 62.0 in | Wt 129.6 lb

## 2023-09-26 DIAGNOSIS — Z0000 Encounter for general adult medical examination without abnormal findings: Secondary | ICD-10-CM | POA: Diagnosis not present

## 2023-09-26 DIAGNOSIS — F411 Generalized anxiety disorder: Secondary | ICD-10-CM | POA: Diagnosis not present

## 2023-09-26 DIAGNOSIS — F331 Major depressive disorder, recurrent, moderate: Secondary | ICD-10-CM | POA: Diagnosis not present

## 2023-09-26 DIAGNOSIS — F4310 Post-traumatic stress disorder, unspecified: Secondary | ICD-10-CM

## 2023-09-26 DIAGNOSIS — F17201 Nicotine dependence, unspecified, in remission: Secondary | ICD-10-CM

## 2023-09-26 DIAGNOSIS — F1491 Cocaine use, unspecified, in remission: Secondary | ICD-10-CM

## 2023-09-26 DIAGNOSIS — F1091 Alcohol use, unspecified, in remission: Secondary | ICD-10-CM

## 2023-09-26 MED ORDER — HYDROXYZINE HCL 25 MG PO TABS: 25.0000 mg | ORAL_TABLET | Freq: Three times a day (TID) | ORAL | 1 refills | Status: DC | PRN

## 2023-09-26 MED ORDER — FLUOXETINE HCL 10 MG PO CAPS
10.0000 mg | ORAL_CAPSULE | Freq: Every day | ORAL | 1 refills | Status: DC
Start: 2023-09-26 — End: 2023-11-15

## 2023-09-26 MED ORDER — HYDROXYZINE HCL 25 MG PO TABS
25.0000 mg | ORAL_TABLET | Freq: Three times a day (TID) | ORAL | 1 refills | Status: DC | PRN
Start: 2023-09-26 — End: 2023-11-15
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
## 2023-09-26 NOTE — Patient Instructions (Signed)
 [facility]  URGENT CARE: Open 24 hours per day for acute and/or urgent behavioral health concerns.   OUTPATIENT Walk-in information:  Please note, all walk-ins are first come & first serve, with limited number of availability. Therapist for therapy:  Monday, Tuesday, Wednesday & Thursday mornings Please ARRIVE at 7:00 AM for registration Will START at 8:00 AM Every 1st, 2nd & 3rd Friday of the month: Please ARRIVE at 7:00 AM for registration Will START at 1 PM - 5 PM Psychiatrist for medication management: Monday - Friday:  Please ARRIVE at 7:00 AM for registration Will START at 8:00 AM Appointments times are as follows:  - New patients get 1 hr ex: 8-9 am 9-10 & 10-11 then that's it.  - Existing pt's that are not seeing their provider will still take 1hr as they would be new to the provider that is covering walk ins that day.  - Existing follow ups get 30 mins.... (if they have been seen by the provider covering walk ins that day.)        Regretfully, due to limited availability, please be aware that you may not been seen on the same day as walk-in. Please consider making an appoint or try again. Thank you for your patience and understanding.  [facility] [place] [address] [place], [place] [address] [phone]  New patients are seen at their walk-in clinic. Walk-in hours are Monday - Friday from 8:30 am - 12:00 pm, and from 1:00 pm - 2:30 pm.   Walk-in patients are seen on a first come, first served basis, so try to arrive as early as possible for the best chance of being seen the same day.

## 2023-10-03 NOTE — Addendum Note (Signed)
 Addended by: Ulysess Gang A on: 10/03/2023 04:22 PM   Modules accepted: Level of Service

## 2023-10-18 ENCOUNTER — Encounter (HOSPITAL_COMMUNITY): Payer: MEDICAID | Admitting: Student

## 2023-11-15 ENCOUNTER — Other Ambulatory Visit (HOSPITAL_COMMUNITY): Payer: Self-pay | Admitting: Psychiatry

## 2023-11-15 DIAGNOSIS — F411 Generalized anxiety disorder: Secondary | ICD-10-CM

## 2023-11-15 DIAGNOSIS — F331 Major depressive disorder, recurrent, moderate: Secondary | ICD-10-CM

## 2023-11-15 DIAGNOSIS — F4310 Post-traumatic stress disorder, unspecified: Secondary | ICD-10-CM

## 2023-11-15 MED ORDER — FLUOXETINE HCL 10 MG PO CAPS: 10.0000 mg | ORAL_CAPSULE | Freq: Every day | ORAL | 1 refills | Status: DC

## 2023-11-15 MED ORDER — HYDROXYZINE HCL 25 MG PO TABS
ORAL_TABLET | ORAL | 1 refills | Status: DC
Start: 2023-11-15 — End: 2023-11-27

## 2023-11-15 NOTE — Progress Notes (Signed)
 Patient missed last appointment with resident MD (who has since graduated) and presented to clinic requesting refill of psychiatric medications. She has been scheduled with this provider for 11/27/23 at 10AM and refill provided to bridge until that appointment.  Sylvia DELENA PUMMEL, MD 11/15/23
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
## 2023-11-27 ENCOUNTER — Encounter (HOSPITAL_COMMUNITY): Payer: Self-pay | Admitting: Psychiatry

## 2023-11-27 ENCOUNTER — Telehealth (INDEPENDENT_AMBULATORY_CARE_PROVIDER_SITE_OTHER): Payer: MEDICAID | Admitting: Psychiatry

## 2023-11-27 DIAGNOSIS — F4310 Post-traumatic stress disorder, unspecified: Secondary | ICD-10-CM | POA: Diagnosis not present

## 2023-11-27 DIAGNOSIS — F411 Generalized anxiety disorder: Secondary | ICD-10-CM | POA: Diagnosis not present

## 2023-11-27 DIAGNOSIS — F1491 Cocaine use, unspecified, in remission: Secondary | ICD-10-CM | POA: Diagnosis not present

## 2023-11-27 DIAGNOSIS — F331 Major depressive disorder, recurrent, moderate: Secondary | ICD-10-CM

## 2023-11-27 DIAGNOSIS — F1091 Alcohol use, unspecified, in remission: Secondary | ICD-10-CM

## 2023-11-27 MED ORDER — HYDROXYZINE HCL 25 MG PO TABS
ORAL_TABLET | ORAL | 1 refills | Status: DC
Start: 2023-11-27 — End: 2024-01-15

## 2023-11-27 MED ORDER — TRAZODONE HCL 50 MG PO TABS: 25.0000 mg | ORAL_TABLET | Freq: Every evening | ORAL | 1 refills | Status: DC | PRN

## 2023-11-27 MED ORDER — FLUOXETINE HCL 20 MG PO CAPS
20.0000 mg | ORAL_CAPSULE | Freq: Every morning | ORAL | 1 refills | Status: DC
Start: 2023-11-27 — End: 2024-01-15

## 2023-11-27 NOTE — Patient Instructions (Signed)
 Thank you for attending your appointment today.  -- INCREASE fluoxetine  to 20 mg and move to the morning time -- START trazodone  25-50 mg nightly as needed for sleep -- Continue other medications as prescribed.  Please do not make any changes to medications without first discussing with your provider. If you are experiencing a psychiatric emergency, please call 911 or present to your nearest emergency department. Additional crisis, medication management, and therapy resources are included below.  [facility]  [address] [place], [place] [address] [phone] WALK-IN URGENT CARE 24/7 FOR ANYONE [address] [place], [place]  [phone] Fax: [phone] [email or web address] *Interpreters available *Accepts all insurance and uninsured for Urgent Care needs *Accepts Medicaid and uninsured for outpatient treatment (below)      ONLY FOR [facility]  Below:    Outpatient New Patient Assessment/Therapy Walk-ins:        Monday, Wednesday, and Thursday 8am until slots are full (first come, first served)                   New Patient Psychiatry/Medication Management        Monday-Friday 8am-11am (first come, first served)               For all walk-ins we ask that you arrive by 7:15am, because patients will be seen in the order of arrival.
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
## 2024-01-15 ENCOUNTER — Encounter (HOSPITAL_COMMUNITY): Payer: Self-pay | Admitting: Psychiatry

## 2024-01-15 ENCOUNTER — Telehealth (INDEPENDENT_AMBULATORY_CARE_PROVIDER_SITE_OTHER): Payer: MEDICAID | Admitting: Psychiatry

## 2024-01-15 DIAGNOSIS — F4310 Post-traumatic stress disorder, unspecified: Secondary | ICD-10-CM | POA: Diagnosis not present

## 2024-01-15 DIAGNOSIS — F411 Generalized anxiety disorder: Secondary | ICD-10-CM

## 2024-01-15 DIAGNOSIS — F331 Major depressive disorder, recurrent, moderate: Secondary | ICD-10-CM

## 2024-01-15 MED ORDER — HYDROXYZINE HCL 25 MG PO TABS
ORAL_TABLET | ORAL | 2 refills | Status: AC
Start: 2024-01-15 — End: ?

## 2024-01-15 MED ORDER — TRAZODONE HCL 50 MG PO TABS: 25.0000 mg | ORAL_TABLET | Freq: Every evening | ORAL | 2 refills | Status: AC | PRN

## 2024-01-15 MED ORDER — FLUOXETINE HCL 20 MG PO CAPS
20.0000 mg | ORAL_CAPSULE | Freq: Every morning | ORAL | 2 refills | Status: AC
Start: 2024-01-15 — End: 2024-04-14

## 2024-01-15 NOTE — Patient Instructions (Signed)
 Thank you for attending your appointment today.  -- For sleep, use either trazodone  25-50 mg nightly OR hydroxyzine  50 mg nightly -- Continue other medications as prescribed.  Please do not make any changes to medications without first discussing with your provider. If you are experiencing a psychiatric emergency, please call 911 or present to your nearest emergency department. Additional crisis, medication management, and therapy resources are included below.  [facility]  [address], [place], [place] [address] [phone] WALK-IN URGENT CARE 24/7 FOR ANYONE [address] [place], [place]  [phone] Fax: [phone] [email or web address] *Interpreters available *Accepts all insurance and uninsured for Urgent Care needs *Accepts Medicaid and uninsured for outpatient treatment (below)      ONLY FOR [facility]  Below:    Outpatient New Patient Assessment/Therapy Walk-ins:        Monday, Wednesday, and Thursday 8am until slots are full (first come, first served)                   New Patient Psychiatry/Medication Management        Monday-Friday 8am-11am (first come, first served)               For all walk-ins we ask that you arrive by 7:15am, because patients will be seen in the order of arrival.

## 2024-02-19 ENCOUNTER — Encounter (HOSPITAL_COMMUNITY): Payer: Self-pay | Admitting: *Deleted

## 2024-02-19 ENCOUNTER — Other Ambulatory Visit: Payer: Self-pay

## 2024-02-19 ENCOUNTER — Inpatient Hospital Stay (HOSPITAL_COMMUNITY)
Admission: EM | Admit: 2024-02-19 | Discharge: 2024-02-25 | DRG: 439 | Disposition: A | Payer: MEDICAID | Attending: Internal Medicine | Admitting: Internal Medicine

## 2024-02-19 DIAGNOSIS — F1491 Cocaine use, unspecified, in remission: Secondary | ICD-10-CM | POA: Diagnosis present

## 2024-02-19 DIAGNOSIS — N760 Acute vaginitis: Secondary | ICD-10-CM | POA: Diagnosis present

## 2024-02-19 DIAGNOSIS — R630 Anorexia: Secondary | ICD-10-CM | POA: Diagnosis present

## 2024-02-19 DIAGNOSIS — Z6823 Body mass index (BMI) 23.0-23.9, adult: Secondary | ICD-10-CM

## 2024-02-19 DIAGNOSIS — F331 Major depressive disorder, recurrent, moderate: Secondary | ICD-10-CM | POA: Diagnosis present

## 2024-02-19 DIAGNOSIS — Z79899 Other long term (current) drug therapy: Secondary | ICD-10-CM

## 2024-02-19 DIAGNOSIS — F1011 Alcohol abuse, in remission: Secondary | ICD-10-CM | POA: Diagnosis present

## 2024-02-19 DIAGNOSIS — F4310 Post-traumatic stress disorder, unspecified: Secondary | ICD-10-CM | POA: Diagnosis present

## 2024-02-19 DIAGNOSIS — D649 Anemia, unspecified: Secondary | ICD-10-CM | POA: Diagnosis present

## 2024-02-19 DIAGNOSIS — Z8673 Personal history of transient ischemic attack (TIA), and cerebral infarction without residual deficits: Secondary | ICD-10-CM

## 2024-02-19 DIAGNOSIS — F1411 Cocaine abuse, in remission: Secondary | ICD-10-CM | POA: Diagnosis present

## 2024-02-19 DIAGNOSIS — Z818 Family history of other mental and behavioral disorders: Secondary | ICD-10-CM

## 2024-02-19 DIAGNOSIS — Z87891 Personal history of nicotine dependence: Secondary | ICD-10-CM

## 2024-02-19 DIAGNOSIS — G459 Transient cerebral ischemic attack, unspecified: Secondary | ICD-10-CM | POA: Diagnosis present

## 2024-02-19 DIAGNOSIS — F1091 Alcohol use, unspecified, in remission: Secondary | ICD-10-CM | POA: Diagnosis present

## 2024-02-19 DIAGNOSIS — F411 Generalized anxiety disorder: Secondary | ICD-10-CM | POA: Diagnosis present

## 2024-02-19 DIAGNOSIS — K852 Alcohol induced acute pancreatitis without necrosis or infection: Principal | ICD-10-CM | POA: Diagnosis present

## 2024-02-19 DIAGNOSIS — K859 Acute pancreatitis without necrosis or infection, unspecified: Principal | ICD-10-CM | POA: Diagnosis present

## 2024-02-19 LAB — CBC
HCT: 31.7 % — ABNORMAL LOW (ref 36.0–46.0)
Hemoglobin: 10.3 g/dL — ABNORMAL LOW (ref 12.0–15.0)
MCH: 26.9 pg (ref 26.0–34.0)
MCHC: 32.5 g/dL (ref 30.0–36.0)
MCV: 82.8 fL (ref 80.0–100.0)
Platelets: 232 K/uL (ref 150–400)
RBC: 3.83 MIL/uL — ABNORMAL LOW (ref 3.87–5.11)
RDW: 13 % (ref 11.5–15.5)
WBC: 4.9 K/uL (ref 4.0–10.5)
nRBC: 0 % (ref 0.0–0.2)

## 2024-02-19 LAB — COMPREHENSIVE METABOLIC PANEL WITH GFR
ALT: 15 U/L (ref 0–44)
AST: 27 U/L (ref 15–41)
Albumin: 3.1 g/dL — ABNORMAL LOW (ref 3.5–5.0)
Alkaline Phosphatase: 51 U/L (ref 38–126)
Anion gap: 12 (ref 5–15)
BUN: 11 mg/dL (ref 6–20)
CO2: 23 mmol/L (ref 22–32)
Calcium: 9.2 mg/dL (ref 8.9–10.3)
Chloride: 102 mmol/L (ref 98–111)
Creatinine, Ser: 0.82 mg/dL (ref 0.44–1.00)
GFR, Estimated: >60 mL/min (ref >60)
Glucose, Bld: 172 mg/dL — ABNORMAL HIGH (ref 70–99)
Potassium: 3.4 mmol/L — ABNORMAL LOW (ref 3.5–5.1)
Sodium: 137 mmol/L (ref 135–145)
Total Bilirubin: 0.4 mg/dL (ref 0.0–1.2)
Total Protein: 7.1 g/dL (ref 6.5–8.1)

## 2024-02-19 LAB — LIPASE, BLOOD: Lipase: 638 U/L — ABNORMAL HIGH (ref 11–51)

## 2024-02-19 NOTE — ED Provider Triage Note (Signed)
 Emergency Medicine Provider Triage Evaluation Note  Sylvia Arellano , a 53 y.o. female  was evaluated in triage.  Pt complains of abdominal pain, fatigue, loss of appetite, no vomiting. Also reports vaginal dc with odor, has not had intercourse x 5 years.  Review of Systems  Positive:  Negative:   Physical Exam  BP (!) 114/90 (BP Location: Right Arm)   Pulse 74   Temp 98.7 F (37.1 C) (Oral)   Resp 15   Ht 5' 2 (1.575 m)   Wt 58.8 kg   LMP  (LMP Unknown)   SpO2 100%   BMI 23.71 kg/m  Gen:   Awake, no distress   Resp:  Normal effort  MSK:   Moves extremities without difficulty  Other:    Medical Decision Making  Medically screening exam initiated at 6:17 PM.  Appropriate orders placed.  Sylvia Arellano was informed that the remainder of the evaluation will be completed by another provider, this initial triage assessment does not replace that evaluation, and the importance of remaining in the ED until their evaluation is complete.     Beverley Leita LABOR, PA-C 02/19/24 1818

## 2024-02-19 NOTE — ED Triage Notes (Signed)
 PT arrives via POV. PT reports fatigue, abdominal pain, and decreased appetite since Sunday. PT AxOx4.

## 2024-02-20 ENCOUNTER — Emergency Department (HOSPITAL_COMMUNITY): Payer: MEDICAID

## 2024-02-20 DIAGNOSIS — K859 Acute pancreatitis without necrosis or infection, unspecified: Secondary | ICD-10-CM | POA: Diagnosis not present

## 2024-02-20 DIAGNOSIS — F1410 Cocaine abuse, uncomplicated: Secondary | ICD-10-CM

## 2024-02-20 DIAGNOSIS — F1010 Alcohol abuse, uncomplicated: Secondary | ICD-10-CM | POA: Diagnosis not present

## 2024-02-20 DIAGNOSIS — F329 Major depressive disorder, single episode, unspecified: Secondary | ICD-10-CM | POA: Diagnosis not present

## 2024-02-20 LAB — COMPREHENSIVE METABOLIC PANEL WITH GFR
ALT: 15 U/L (ref 0–44)
AST: 20 U/L (ref 15–41)
Albumin: 2.4 g/dL — ABNORMAL LOW (ref 3.5–5.0)
Alkaline Phosphatase: 40 U/L (ref 38–126)
Anion gap: 7 (ref 5–15)
BUN: 8 mg/dL (ref 6–20)
CO2: 19 mmol/L — ABNORMAL LOW (ref 22–32)
Calcium: 7.4 mg/dL — ABNORMAL LOW (ref 8.9–10.3)
Chloride: 110 mmol/L (ref 98–111)
Creatinine, Ser: 0.6 mg/dL (ref 0.44–1.00)
GFR, Estimated: >60 mL/min (ref >60)
Glucose, Bld: 104 mg/dL — ABNORMAL HIGH (ref 70–99)
Potassium: 4 mmol/L (ref 3.5–5.1)
Sodium: 136 mmol/L (ref 135–145)
Total Bilirubin: 0.3 mg/dL (ref 0.0–1.2)
Total Protein: 5.4 g/dL — ABNORMAL LOW (ref 6.5–8.1)

## 2024-02-20 LAB — CBC WITH DIFFERENTIAL/PLATELET
Basophils Absolute: 0 K/uL (ref 0.0–0.1)
Basophils Relative: 0 %
Eosinophils Absolute: 0.1 K/uL (ref 0.0–0.5)
Eosinophils Relative: 3 %
HCT: 29 % — ABNORMAL LOW (ref 36.0–46.0)
Hemoglobin: 9.3 g/dL — ABNORMAL LOW (ref 12.0–15.0)
Lymphocytes Relative: 40 %
Lymphs Abs: 1.6 K/uL (ref 0.7–4.0)
MCH: 26.5 pg (ref 26.0–34.0)
MCHC: 32.1 g/dL (ref 30.0–36.0)
MCV: 82.6 fL (ref 80.0–100.0)
Monocytes Absolute: 0.1 K/uL (ref 0.1–1.0)
Monocytes Relative: 3 %
Neutro Abs: 2.2 K/uL (ref 1.7–7.7)
Neutrophils Relative %: 54 %
Platelets: 187 K/uL (ref 150–400)
RBC: 3.51 MIL/uL — ABNORMAL LOW (ref 3.87–5.11)
RDW: 13.1 % (ref 11.5–15.5)
WBC: 4 K/uL (ref 4.0–10.5)
nRBC: 0 % (ref 0.0–0.2)

## 2024-02-20 LAB — HIV ANTIBODY (ROUTINE TESTING W REFLEX): HIV Screen 4th Generation wRfx: NONREACTIVE

## 2024-02-20 LAB — URINALYSIS, ROUTINE W REFLEX MICROSCOPIC
Bacteria, UA: NONE SEEN
Bilirubin Urine: NEGATIVE
Glucose, UA: NEGATIVE mg/dL
Ketones, ur: NEGATIVE mg/dL
Nitrite: NEGATIVE
Protein, ur: 30 mg/dL — AB
Specific Gravity, Urine: 1.023 (ref 1.005–1.030)
pH: 5 (ref 5.0–8.0)

## 2024-02-20 LAB — TRIGLYCERIDES: Triglycerides: 42 mg/dL (ref <150)

## 2024-02-20 LAB — ETHANOL: Alcohol, Ethyl (B): <15 mg/dL (ref <15)

## 2024-02-20 MED ORDER — ADULT MULTIVITAMIN W/MINERALS CH
1.0000 | ORAL_TABLET | Freq: Every day | ORAL | Status: DC
  Administered 2024-02-20 – 2024-02-25 (×6): 1 via ORAL
  Filled 2024-02-20 (×6): qty 1

## 2024-02-20 MED ORDER — ACETAMINOPHEN 325 MG PO TABS: 650.0000 mg | ORAL_TABLET | Freq: Four times a day (QID) | ORAL | Status: DC | PRN

## 2024-02-20 MED ORDER — SODIUM CHLORIDE 0.9 % IV BOLUS
1000.0000 mL | Freq: Once | INTRAVENOUS | Status: AC
  Administered 2024-02-20: 1000 mL via INTRAVENOUS

## 2024-02-20 MED ORDER — ONDANSETRON HCL 4 MG/2ML IJ SOLN
4.0000 mg | Freq: Once | INTRAMUSCULAR | Status: AC
  Administered 2024-02-20: 4 mg via INTRAVENOUS
  Filled 2024-02-20: qty 2

## 2024-02-20 MED ORDER — TRAZODONE HCL 50 MG PO TABS
50.0000 mg | ORAL_TABLET | Freq: Every day | ORAL | Status: DC
  Administered 2024-02-20 – 2024-02-24 (×5): 50 mg via ORAL
  Filled 2024-02-20 (×5): qty 1

## 2024-02-20 MED ORDER — THIAMINE MONONITRATE 100 MG PO TABS
100.0000 mg | ORAL_TABLET | Freq: Every day | ORAL | Status: DC
  Administered 2024-02-20 – 2024-02-25 (×6): 100 mg via ORAL
  Filled 2024-02-20 (×6): qty 1

## 2024-02-20 MED ORDER — THIAMINE HCL 100 MG/ML IJ SOLN: 100.0000 mg | Freq: Every day | INTRAMUSCULAR | Status: DC

## 2024-02-20 MED ORDER — IOHEXOL 350 MG/ML SOLN
75.0000 mL | Freq: Once | INTRAVENOUS | Status: AC | PRN
  Administered 2024-02-20: 75 mL via INTRAVENOUS

## 2024-02-20 MED ORDER — SODIUM CHLORIDE 0.9 % IV SOLN: INTRAVENOUS | Status: DC

## 2024-02-20 MED ORDER — MORPHINE SULFATE (PF) 4 MG/ML IV SOLN
4.0000 mg | Freq: Once | INTRAVENOUS | Status: AC
  Administered 2024-02-20: 4 mg via INTRAVENOUS
  Filled 2024-02-20: qty 1

## 2024-02-20 MED ORDER — OXYCODONE HCL 5 MG PO TABS
5.0000 mg | ORAL_TABLET | ORAL | Status: DC | PRN
  Administered 2024-02-20 – 2024-02-23 (×7): 5 mg via ORAL
  Filled 2024-02-20 (×7): qty 1

## 2024-02-20 MED ORDER — ENOXAPARIN SODIUM 40 MG/0.4ML IJ SOSY
40.0000 mg | PREFILLED_SYRINGE | INTRAMUSCULAR | Status: DC
  Administered 2024-02-20 – 2024-02-24 (×5): 40 mg via SUBCUTANEOUS
  Filled 2024-02-20 (×5): qty 0.4

## 2024-02-20 MED ORDER — HYDROXYZINE HCL 25 MG PO TABS
25.0000 mg | ORAL_TABLET | Freq: Four times a day (QID) | ORAL | Status: DC | PRN
  Administered 2024-02-21 – 2024-02-24 (×3): 25 mg via ORAL
  Filled 2024-02-20 (×3): qty 1

## 2024-02-20 MED ORDER — LORAZEPAM 1 MG PO TABS: 1.0000 mg | ORAL_TABLET | ORAL | Status: DC | PRN

## 2024-02-20 MED ORDER — FOLIC ACID 1 MG PO TABS
1.0000 mg | ORAL_TABLET | Freq: Every day | ORAL | Status: DC
Start: 2024-02-20 — End: 2024-02-25
  Administered 2024-02-20 – 2024-02-25 (×6): 1 mg via ORAL
  Filled 2024-02-20 (×6): qty 1

## 2024-02-20 MED ORDER — FLUOXETINE HCL 20 MG PO CAPS
20.0000 mg | ORAL_CAPSULE | Freq: Every morning | ORAL | Status: DC
  Administered 2024-02-20 – 2024-02-25 (×6): 20 mg via ORAL
  Filled 2024-02-20 (×6): qty 1

## 2024-02-20 MED ORDER — FENTANYL CITRATE PF 50 MCG/ML IJ SOSY
25.0000 ug | PREFILLED_SYRINGE | INTRAMUSCULAR | Status: DC | PRN
  Administered 2024-02-20 – 2024-02-23 (×15): 50 ug via INTRAVENOUS
  Filled 2024-02-20 (×15): qty 1

## 2024-02-20 MED ORDER — PANTOPRAZOLE SODIUM 40 MG IV SOLR
40.0000 mg | Freq: Two times a day (BID) | INTRAVENOUS | Status: DC
  Administered 2024-02-20 – 2024-02-22 (×5): 40 mg via INTRAVENOUS
  Filled 2024-02-20 (×5): qty 10

## 2024-02-20 MED ORDER — PROCHLORPERAZINE EDISYLATE 10 MG/2ML IJ SOLN: 5.0000 mg | Freq: Four times a day (QID) | INTRAMUSCULAR | Status: DC | PRN

## 2024-02-20 MED ORDER — LORAZEPAM 2 MG/ML IJ SOLN: 1.0000 mg | INTRAMUSCULAR | Status: DC | PRN

## 2024-02-20 NOTE — Plan of Care (Signed)

## 2024-02-20 NOTE — ED Notes (Signed)
 Pt ambulated to restroom without assistance. Pt placed back on monitor upon returning to room, call light within reach.

## 2024-02-20 NOTE — Assessment & Plan Note (Signed)
 Pt self reports recent ETOH binge with concominant pancreatitis within the last 7 days  Check ETOH level to correlate  Start CIWA protocol  TOC consult  Monitor

## 2024-02-20 NOTE — Assessment & Plan Note (Signed)
 Pt self reports cocaine binge roughly 5-7 days ago  Denies any other illicit drug use  Check UDS to correlate  Discussed cessation at length at the bedside  [facility] consult  Monitor

## 2024-02-20 NOTE — ED Notes (Signed)
RN adjusted pt BP cuff.

## 2024-02-20 NOTE — ED Notes (Signed)
MD Alvester MorinNewton at bedside.

## 2024-02-20 NOTE — ED Provider Notes (Signed)
 [place] [facility] DEPARTMENT AT [facility] Provider Note   CSN: [id number] Arrival date & time: 02/19/24  1729     Patient presents with: Fatigue and Decreased Appetite   LISSANDRA KEIL is a 53 y.o. female.   HPI     This is a 53 year old female who presents with abdominal pain.  Patient reports abdominal pain onset on Sunday.  She reports fatigue and loss of appetite as well.  She states that the abdominal pain is in the upper abdomen and radiates to the back.  Reports nausea without vomiting.  Also notes some urinary frequency.  Denies fevers.  Patient reports that she occasionally uses cocaine and alcohol.  Prior to Admission medications   Medication Sig Start Date End Date Taking? Authorizing Provider  FLUoxetine  (PROZAC ) 20 MG capsule Take 1 capsule (20 mg total) by mouth in the morning. 01/15/24 04/14/24  Bahraini, Sarah A  hydrOXYzine  (ATARAX ) 25 MG tablet May take 1 tablet (25 mg total) by mouth 3 (three) times daily as needed for anxiety. May also take 1-2 tablets (25-50 mg total) at bedtime as needed for sleep. 01/15/24   [place]i, Sarah A  traZODone  (DESYREL ) 50 MG tablet Take 0.5-1 tablets (25-50 mg total) by mouth at bedtime as needed for sleep. 01/15/24 04/14/24  [place]i, Sarah A    Allergies: Patient has no known allergies.    Review of Systems  Constitutional:  Negative for fever.  Respiratory:  Negative for shortness of breath.   Cardiovascular:  Negative for chest pain.  Gastrointestinal:  Positive for abdominal pain and nausea. Negative for diarrhea and vomiting.  Genitourinary:  Positive for dysuria.  All other systems reviewed and are negative.   Updated Vital Signs BP 105/81   Pulse 69   Temp 99.1 F (37.3 C)   Resp 14   Ht 1.575 m (5' 2)   Wt 58.8 kg   LMP  (LMP Unknown)   SpO2 97%   BMI 23.71 kg/m   Physical Exam Vitals and nursing note reviewed.  Constitutional:      Appearance: She is well-developed. She is not ill-appearing.   HENT:     Head: Normocephalic and atraumatic.  Eyes:     Pupils: Pupils are equal, round, and reactive to light.  Cardiovascular:     Rate and Rhythm: Normal rate and regular rhythm.     Heart sounds: Normal heart sounds.  Pulmonary:     Effort: Pulmonary effort is normal. No respiratory distress.     Breath sounds: No wheezing.  Abdominal:     General: Bowel sounds are normal.     Palpations: Abdomen is soft.     Tenderness: There is abdominal tenderness.     Comments: Epigastric tenderness to palpation without rebound or guarding  Musculoskeletal:     Cervical back: Neck supple.  Skin:    General: Skin is warm and dry.  Neurological:     Mental Status: She is alert and oriented to person, place, and time.  Psychiatric:        Mood and Affect: Mood normal.     (all labs ordered are listed, but only abnormal results are displayed) Labs Reviewed  LIPASE, BLOOD - Abnormal; Notable for the following components:      Result Value   Lipase 638 (*)    All other components within normal limits  COMPREHENSIVE METABOLIC PANEL WITH GFR - Abnormal; Notable for the following components:   Potassium 3.4 (*)    Glucose, Bld  172 (*)    Albumin 3.1 (*)    All other components within normal limits  CBC - Abnormal; Notable for the following components:   RBC 3.83 (*)    Hemoglobin 10.3 (*)    HCT 31.7 (*)    All other components within normal limits  URINALYSIS, ROUTINE W REFLEX MICROSCOPIC - Abnormal; Notable for the following components:   APPearance HAZY (*)    Hgb urine dipstick SMALL (*)    Protein, ur 30 (*)    Leukocytes,Ua MODERATE (*)    All other components within normal limits    EKG: None  Radiology: CT ABDOMEN PELVIS W CONTRAST Result Date: 02/20/2024 EXAM: CT ABDOMEN AND PELVIS WITH CONTRAST 02/20/2024 03:36:20 AM TECHNIQUE: CT of the abdomen and pelvis was performed with the administration of 75 mL iohexol (OMNIPAQUE) 350 MG/ML injection. Multiplanar reformatted  images are provided for review. Automated exposure control, iterative reconstruction, and/or weight-based adjustment of the mA/kV was utilized to reduce the radiation dose to as low as reasonably achievable. COMPARISON: None available. CLINICAL HISTORY: Pancreatitis, acute, severe. Patient reports fatigue, abdominal pain, and decreased appetite since Sunday. Patient is alert and oriented x4. FINDINGS: LOWER CHEST: No acute abnormality. LIVER: The liver is unremarkable. GALLBLADDER AND BILE DUCTS: Gallbladder is unremarkable. No biliary ductal dilatation. SPLEEN: No acute abnormality. PANCREAS: No acute abnormality. ADRENAL GLANDS: No acute abnormality. KIDNEYS, URETERS AND BLADDER: No stones in the kidneys or ureters. No hydronephrosis. No perinephric or periureteral stranding. Urinary bladder is unremarkable. GI AND BOWEL: Stomach demonstrates no acute abnormality. There is no bowel obstruction. Normal appendix. PERITONEUM AND RETROPERITONEUM: Small amount of free fluid in the pelvis. No free air. VASCULATURE: Aorta is normal in caliber. LYMPH NODES: No lymphadenopathy. REPRODUCTIVE ORGANS: Multiple fibroids in the uterus. The largest is in the uterine fundus and measures approximately 4.4 cm. BONES AND SOFT TISSUES: No acute osseous abnormality. No focal soft tissue abnormality. IMPRESSION: 1. No acute findings in the abdomen or pelvis. 2. Fibroid uterus. Electronically signed by: Norman Gatlin MD 02/20/2024 03:43 AM EDT RP Workstation: [id number]   US  Abdomen Limited RUQ (LIVER/GB) Result Date: 02/20/2024 EXAM: Right Upper Quadrant Abdominal Ultrasound 02/20/2024 01:42:42 AM TECHNIQUE: Real-time ultrasonography of the right upper quadrant of the abdomen was performed. COMPARISON: None available. CLINICAL HISTORY: RUQ pain 151471. RUQ pain. FINDINGS: LIVER: The liver demonstrates normal echogenicity. No intrahepatic biliary ductal dilatation. No evidence of mass. Patent portal vein with antegrade flow. BILIARY  SYSTEM: No pericholecystic fluid or wall thickening. No cholelithiasis. Negative sonographic Murphy's sign. Common bile duct is within normal limits measuring 5 mm. OTHER: No right upper quadrant ascites. IMPRESSION: 1. No acute abnormalities. Electronically signed by: Norman Gatlin MD 02/20/2024 01:54 AM EDT RP Workstation: [id number]     Procedures   Medications Ordered in the ED  morphine (PF) 4 MG/ML injection 4 mg (4 mg Intravenous Given 02/20/24 0153)  ondansetron  (ZOFRAN ) injection 4 mg (4 mg Intravenous Given 02/20/24 0151)  iohexol (OMNIPAQUE) 350 MG/ML injection 75 mL (75 mLs Intravenous Contrast Given 02/20/24 0337)    Clinical Course as of 02/20/24 0438  Thu Feb 20, 2024  0437 Patient reports ongoing pain.  Discussed trial of outpatient pain medication and clear liquid diet versus admission for pain control.  Patient states that she feels like she needs to be admitted. [CH]    Clinical Course User Index [CH] Fawn Desrocher, Charmaine FALCON, MD  Medical Decision Making Amount and/or Complexity of Data Reviewed Radiology: ordered.  Risk Prescription drug management. Decision regarding hospitalization.   This patient presents to the ED for concern of abdominal pain, this involves an extensive number of treatment options, and is a complaint that carries with it a high risk of complications and morbidity.  I considered the following differential and admission for this acute, potentially life threatening condition.  The differential diagnosis includes gastritis, gastroenteritis, pancreatitis, cholecystitis, SBO, UTI  MDM:    This is a 54 year old female who presents with abdominal pain.  She is nontoxic-appearing and vital signs are reassuring.  Tenderness is mostly in the epigastrium.  Patient was given pain and nausea medication.  Labs notable for a lipase of 638.  Urinalysis is not consistent with UTI.  Liver function testing and CBC are reassuring.  Right  upper quadrant ultrasound without evidence of gallstones.  Patient does report some alcohol use but states that it is weekly and not daily.  CT scan does not show any acute complications such as pseudocyst.  Patient was able to tolerate a few sips of fluid but maintained that she continued to have pain.  Will plan for admission for pain control.  (Labs, imaging, consults)  Labs: I Ordered, and personally interpreted labs.  The pertinent results include: CBC, CMP, lipase, urinalysis  Imaging Studies ordered: I ordered imaging studies including right upper quadrant ultrasound, CT I independently visualized and interpreted imaging. I agree with the radiologist interpretation  Additional history obtained from chart review.  External records from outside source obtained and reviewed including prior evaluations  Cardiac Monitoring: The patient was maintained on a cardiac monitor.  If on the cardiac monitor, I personally viewed and interpreted the cardiac monitored which showed an underlying rhythm of: Sinus  Reevaluation: After the interventions noted above, I reevaluated the patient and found that they have :stayed the same  Social Determinants of Health:  lives independently  Disposition: Admit  Co morbidities that complicate the patient evaluation  Past Medical History:  Diagnosis Date   Anxiety state 10/02/2022   Cocaine abuse (HCC) 07/09/2014   Cocaine use    Depression    PTSD (post-traumatic stress disorder)    Seizures (HCC)    Suicide attempt (HCC)      Medicines Meds ordered this encounter  Medications   morphine (PF) 4 MG/ML injection 4 mg   ondansetron  (ZOFRAN ) injection 4 mg   iohexol (OMNIPAQUE) 350 MG/ML injection 75 mL    I have reviewed the patients home medicines and have made adjustments as needed  Problem List / ED Course: Problem List Items Addressed This Visit   None Visit Diagnoses       Acute pancreatitis without infection or necrosis, unspecified  pancreatitis type    -  Primary   Relevant Medications   morphine (PF) 4 MG/ML injection 4 mg (Completed)                Final diagnoses:  Acute pancreatitis without infection or necrosis, unspecified pancreatitis type    ED Discharge Orders     None          Bari Charmaine FALCON, MD 02/20/24 [phone]

## 2024-02-20 NOTE — ED Notes (Signed)
 Pt states she has an odor and discharge. Pt denies sexual intercourse over 6 years. Pt denies burning and pain during urination. Pt says increase urine frequency.

## 2024-02-20 NOTE — ED Notes (Signed)
 Dr. Charlton at bedside assessing pt currently

## 2024-02-20 NOTE — Assessment & Plan Note (Signed)
 Remote history of TIA  Nonfocal neuro exam today  Monitor

## 2024-02-20 NOTE — ED Notes (Signed)
 Pt stepped out for a min, said they will return to lobby.

## 2024-02-20 NOTE — ED Notes (Signed)
 CCMD called by this RN

## 2024-02-20 NOTE — ED Notes (Signed)
 RN switched pt BP Cuff and rechecked BP. SBP 85/51  Pt is asymptomatic and Aox4. Denies dizziness and/or pain at this time.   RN will notify Dr. Charlton  Pt currently has 1000 mL bolus running.

## 2024-02-20 NOTE — Assessment & Plan Note (Signed)
 Progressive central and upper abdominal pain x 4-5 days with worsening nausea and decreased po intake Patient reports binge drinking beer 1 day prior to onset of symptoms  Lipase 638  LFTs WNL  CT A&P and abd u/s grossly WNL  Suspect subacute alcoholic pancreatitis  Pain control  IVF  Antiemetics  Check TG and ETOH level  Add on IV PPI for gastritis coverage given mild epigastric tenderness  ADAT  Monitor

## 2024-02-20 NOTE — Assessment & Plan Note (Signed)
 On prozac   Does not report any active HI/SI  Cont home regimen  Monitor

## 2024-02-20 NOTE — ED Notes (Signed)
 Dr. Charlton recommend adding another 1L bolus NS and changing bed placement to progressive.

## 2024-02-20 NOTE — H&P (Signed)
 History and Physical    Patient: Sylvia Arellano FMW:[id number] DOB: 01-02-1971 DOA: 02/19/2024 DOS: the patient was seen and examined on 02/20/2024 PCP: Patient, No Pcp Per  Patient coming from: Home  Chief Complaint:  Chief Complaint  Patient presents with   Fatigue   Decreased Appetite   HPI: Sylvia Arellano is a 53 y.o. female with medical history significant of Cocaine abuse, Depression, ETOH abuse, ? Seizures presenting with pancreatitis, ETOH and cocaine abuse.  Patient reports roughly 5 days of progressive generalized as well as upper abdominal pain.  Symptoms were associated with nausea as well as decreased p.o. intake.  No reports of fevers or chills.  No reported diarrhea.  Patient states that she had an alcoholic binge as well as cocaine binge roughly 1 day prior to onset.  Binge included at least a sixpack of beer.  Unclear if cocaine amount.  States that she does not drink or use cocaine daily apart from when she has time off from work.  Symptoms began within roughly 24 to 36 hours of intake.  Denies any chest pain or shortness of breath.  No focal hemiparesis or confusion.  Baseline depression.  Denies any active HI or SI.  Abdominal pain has progressively worsened over the course of the past 2 to 3 days.  No reported black or bloody emesis.  No focal hemiparesis or confusion.  No longer smokes. Presented to the ER afebrile, blood pressures 80s to 110s over 50s to 70s.  Satting well on room air.  White count 4.9, hemoglobin 10.3, platelets 232, lipase 638.  Glucose 172.  LFTs within normal limits.  CT of the abdomen pelvis as well as abdominal ultrasound grossly stable. Review of Systems: As mentioned in the history of present illness. All other systems reviewed and are negative. Past Medical History:  Diagnosis Date   Anxiety state 10/02/2022   Cocaine abuse (HCC) 07/09/2014   Cocaine use    Depression    PTSD (post-traumatic stress disorder)    Seizures (HCC)    Suicide attempt  [facility])    History reviewed. No pertinent surgical history. Social History:  reports that she has quit smoking. Her smoking use included cigarettes. She has never used smokeless tobacco. She reports current alcohol use. She reports that she does not currently use drugs after having used the following drugs: Cocaine.  No Known Allergies  Family History  Problem Relation Age of Onset   Depression Mother     Prior to Admission medications   Medication Sig Start Date End Date Taking? Authorizing Provider  FLUoxetine  (PROZAC ) 20 MG capsule Take 1 capsule (20 mg total) by mouth in the morning. 01/15/24 04/14/24 Yes [place]i, Sarah A  hydrOXYzine  (ATARAX ) 25 MG tablet May take 1 tablet (25 mg total) by mouth 3 (three) times daily as needed for anxiety. May also take 1-2 tablets (25-50 mg total) at bedtime as needed for sleep. 01/15/24  Yes [place]i, Sarah A  ibuprofen  (ADVIL ) 200 MG tablet Take 200 mg by mouth every 6 (six) hours as needed for headache, fever or mild pain (pain score 1-3).   Yes [provider]  traZODone  (DESYREL ) 50 MG tablet Take 0.5-1 tablets (25-50 mg total) by mouth at bedtime as needed for sleep. Patient taking differently: Take 50 mg by mouth at bedtime. 01/15/24 04/14/24 Yes [facility]    Physical Exam: Vitals:   02/20/24 0704 02/20/24 0730 02/20/24 0800 02/20/24 0815  BP: 98/77 100/72 97/77 93/74   Pulse:  66  Resp:   18 18  Temp:      TempSrc:      SpO2:    96%  Weight:      Height:       Physical Exam Constitutional:      Appearance: She is normal weight.  HENT:     Head: Normocephalic and atraumatic.     Mouth/Throat:     Mouth: Mucous membranes are moist.  Eyes:     Pupils: Pupils are equal, round, and reactive to light.  Cardiovascular:     Rate and Rhythm: Normal rate and regular rhythm.  Pulmonary:     Effort: Pulmonary effort is normal.  Abdominal:     General: Bowel sounds are normal.     Comments: Mild generalized central and  upper abdominal TTP    Musculoskeletal:        General: Normal range of motion.  Skin:    General: Skin is warm.  Neurological:     General: No focal deficit present.  Psychiatric:        Mood and Affect: Mood normal.     Data Reviewed:  There are no new results to review at this time.  CT ABDOMEN PELVIS W CONTRAST EXAM: CT ABDOMEN AND PELVIS WITH CONTRAST 02/20/2024 03:36:20 AM  TECHNIQUE: CT of the abdomen and pelvis was performed with the administration of 75 mL iohexol (OMNIPAQUE) 350 MG/ML injection. Multiplanar reformatted images are provided for review. Automated exposure control, iterative reconstruction, and/or weight-based adjustment of the mA/kV was utilized to reduce the radiation dose to as low as reasonably achievable.  COMPARISON: None available.  CLINICAL HISTORY: Pancreatitis, acute, severe. Patient reports fatigue, abdominal pain, and decreased appetite since Sunday. Patient is alert and oriented x4.  FINDINGS:  LOWER CHEST: No acute abnormality.  LIVER: The liver is unremarkable.  GALLBLADDER AND BILE DUCTS: Gallbladder is unremarkable. No biliary ductal dilatation.  SPLEEN: No acute abnormality.  PANCREAS: No acute abnormality.  ADRENAL GLANDS: No acute abnormality.  KIDNEYS, URETERS AND BLADDER: No stones in the kidneys or ureters. No hydronephrosis. No perinephric or periureteral stranding. Urinary bladder is unremarkable.  GI AND BOWEL: Stomach demonstrates no acute abnormality. There is no bowel obstruction. Normal appendix.  PERITONEUM AND RETROPERITONEUM: Small amount of free fluid in the pelvis. No free air.  VASCULATURE: Aorta is normal in caliber.  LYMPH NODES: No lymphadenopathy.  REPRODUCTIVE ORGANS: Multiple fibroids in the uterus. The largest is in the uterine fundus and measures approximately 4.4 cm.  BONES AND SOFT TISSUES: No acute osseous abnormality. No focal soft tissue abnormality.  IMPRESSION: 1.  No acute findings in the abdomen or pelvis. 2. Fibroid uterus.  Electronically signed by: Norman Gatlin MD 02/20/2024 03:43 AM EDT RP Workstation: [id number]  Abdomen Limited RUQ (LIVER/GB) EXAM: Right Upper Quadrant Abdominal Ultrasound 02/20/2024 01:42:42 AM  TECHNIQUE: Real-time ultrasonography of the right upper quadrant of the abdomen was performed.  COMPARISON: None available.  CLINICAL HISTORY: RUQ pain 151471. RUQ pain.  FINDINGS:  LIVER: The liver demonstrates normal echogenicity. No intrahepatic biliary ductal dilatation. No evidence of mass. Patent portal vein with antegrade flow.  BILIARY SYSTEM: No pericholecystic fluid or wall thickening. No cholelithiasis. Negative sonographic Murphy's sign. Common bile duct is within normal limits measuring 5 mm.  OTHER: No right upper quadrant ascites.  IMPRESSION: 1. No acute abnormalities.  Electronically signed by: Norman Gatlin MD 02/20/2024 01:54 AM EDT RP Workstation: [id number]  Lab Results  Component Value Date   WBC 4.9 02/19/2024  HGB 10.3 (L) 02/19/2024   HCT 31.7 (L) 02/19/2024   MCV 82.8 02/19/2024   PLT 232 02/19/2024   Last metabolic panel Lab Results  Component Value Date   GLUCOSE 172 (H) 02/19/2024   NA 137 02/19/2024   K 3.4 (L) 02/19/2024   CL 102 02/19/2024   CO2 23 02/19/2024   BUN 11 02/19/2024   CREATININE 0.82 02/19/2024   GFRNONAA >60 02/19/2024   CALCIUM  9.2 02/19/2024   PROT 7.1 02/19/2024   ALBUMIN 3.1 (L) 02/19/2024   BILITOT 0.4 02/19/2024   ALKPHOS 51 02/19/2024   AST 27 02/19/2024   ALT 15 02/19/2024   ANIONGAP 12 02/19/2024    Assessment and Plan: * Acute pancreatitis Progressive central and upper abdominal pain x 4-5 days with worsening nausea and decreased po intake Patient reports binge drinking beer 1 day prior to onset of symptoms  Lipase 638  LFTs WNL  CT A&P and abd u/s grossly WNL  Suspect subacute alcoholic pancreatitis  Pain control  IVF   Antiemetics  Check TG and ETOH level  Add on IV PPI for gastritis coverage given mild epigastric tenderness  ADAT  Monitor    MDD (major depressive disorder), recurrent episode, moderate (HCC) On prozac   Does not report any active HI/SI  Cont home regimen  Monitor    Cocaine use disorder in remission Pt self reports cocaine binge roughly 5-7 days ago  Denies any other illicit drug use  Check UDS to correlate  Discussed cessation at length at the bedside  [facility] consult  Monitor    Alcohol use disorder in remission Pt self reports recent ETOH binge with concominant pancreatitis within the last 7 days  Check ETOH level to correlate  Start CIWA protocol  TOC consult  Monitor    TIA (transient ischemic attack) Remote history of TIA  Nonfocal neuro exam today  Monitor        Advance Care Planning:   Code Status: Full Code   Consults: None   Family Communication: no family at the bedside   Severity of Illness: The appropriate patient status for this patient is OBSERVATION. Observation status is judged to be reasonable and necessary in order to provide the required intensity of service to ensure the patient's safety. The patient's presenting symptoms, physical exam findings, and initial radiographic and laboratory data in the context of their medical condition is felt to place them at decreased risk for further clinical deterioration. Furthermore, it is anticipated that the patient will be medically stable for discharge from the hospital within 2 midnights of admission.   Author: Elspeth JINNY Masters, MD 02/20/2024 8:39 AM  For on call review [email or web address].

## 2024-02-20 NOTE — ED Notes (Signed)
 Pt provided beverage per EDP approval.

## 2024-02-21 DIAGNOSIS — K852 Alcohol induced acute pancreatitis without necrosis or infection: Secondary | ICD-10-CM | POA: Diagnosis present

## 2024-02-21 DIAGNOSIS — Z6823 Body mass index (BMI) 23.0-23.9, adult: Secondary | ICD-10-CM | POA: Diagnosis not present

## 2024-02-21 DIAGNOSIS — F1091 Alcohol use, unspecified, in remission: Secondary | ICD-10-CM

## 2024-02-21 DIAGNOSIS — K859 Acute pancreatitis without necrosis or infection, unspecified: Secondary | ICD-10-CM | POA: Diagnosis not present

## 2024-02-21 DIAGNOSIS — G459 Transient cerebral ischemic attack, unspecified: Secondary | ICD-10-CM | POA: Diagnosis not present

## 2024-02-21 DIAGNOSIS — F411 Generalized anxiety disorder: Secondary | ICD-10-CM | POA: Diagnosis present

## 2024-02-21 DIAGNOSIS — F1011 Alcohol abuse, in remission: Secondary | ICD-10-CM | POA: Diagnosis present

## 2024-02-21 DIAGNOSIS — R1010 Upper abdominal pain, unspecified: Secondary | ICD-10-CM | POA: Diagnosis present

## 2024-02-21 DIAGNOSIS — R630 Anorexia: Secondary | ICD-10-CM | POA: Diagnosis present

## 2024-02-21 DIAGNOSIS — D649 Anemia, unspecified: Secondary | ICD-10-CM | POA: Diagnosis present

## 2024-02-21 DIAGNOSIS — F4310 Post-traumatic stress disorder, unspecified: Secondary | ICD-10-CM | POA: Diagnosis present

## 2024-02-21 DIAGNOSIS — F1411 Cocaine abuse, in remission: Secondary | ICD-10-CM | POA: Diagnosis present

## 2024-02-21 DIAGNOSIS — Z8673 Personal history of transient ischemic attack (TIA), and cerebral infarction without residual deficits: Secondary | ICD-10-CM | POA: Diagnosis not present

## 2024-02-21 DIAGNOSIS — Z818 Family history of other mental and behavioral disorders: Secondary | ICD-10-CM | POA: Diagnosis not present

## 2024-02-21 DIAGNOSIS — F331 Major depressive disorder, recurrent, moderate: Secondary | ICD-10-CM | POA: Diagnosis present

## 2024-02-21 DIAGNOSIS — Z79899 Other long term (current) drug therapy: Secondary | ICD-10-CM | POA: Diagnosis not present

## 2024-02-21 DIAGNOSIS — Z87891 Personal history of nicotine dependence: Secondary | ICD-10-CM | POA: Diagnosis not present

## 2024-02-21 DIAGNOSIS — N760 Acute vaginitis: Secondary | ICD-10-CM | POA: Diagnosis present

## 2024-02-21 DIAGNOSIS — F1491 Cocaine use, unspecified, in remission: Secondary | ICD-10-CM | POA: Diagnosis not present

## 2024-02-21 LAB — COMPREHENSIVE METABOLIC PANEL WITH GFR
ALT: 14 U/L (ref 0–44)
AST: 19 U/L (ref 15–41)
Albumin: 2.6 g/dL — ABNORMAL LOW (ref 3.5–5.0)
Alkaline Phosphatase: 41 U/L (ref 38–126)
Anion gap: 7 (ref 5–15)
BUN: <5 mg/dL — ABNORMAL LOW (ref 6–20)
CO2: 24 mmol/L (ref 22–32)
Calcium: 8.4 mg/dL — ABNORMAL LOW (ref 8.9–10.3)
Chloride: 107 mmol/L (ref 98–111)
Creatinine, Ser: 0.71 mg/dL (ref 0.44–1.00)
GFR, Estimated: >60 mL/min (ref >60)
Glucose, Bld: 100 mg/dL — ABNORMAL HIGH (ref 70–99)
Potassium: 4 mmol/L (ref 3.5–5.1)
Sodium: 138 mmol/L (ref 135–145)
Total Bilirubin: 0.2 mg/dL (ref 0.0–1.2)
Total Protein: 6.3 g/dL — ABNORMAL LOW (ref 6.5–8.1)

## 2024-02-21 LAB — CBC
HCT: 28.3 % — ABNORMAL LOW (ref 36.0–46.0)
Hemoglobin: 9.2 g/dL — ABNORMAL LOW (ref 12.0–15.0)
MCH: 26.4 pg (ref 26.0–34.0)
MCHC: 32.5 g/dL (ref 30.0–36.0)
MCV: 81.3 fL (ref 80.0–100.0)
Platelets: 230 K/uL (ref 150–400)
RBC: 3.48 MIL/uL — ABNORMAL LOW (ref 3.87–5.11)
RDW: 13.1 % (ref 11.5–15.5)
WBC: 5.1 K/uL (ref 4.0–10.5)
nRBC: 0 % (ref 0.0–0.2)

## 2024-02-21 LAB — RAPID URINE DRUG SCREEN, HOSP PERFORMED
Amphetamines: NOT DETECTED
Barbiturates: NOT DETECTED
Benzodiazepines: NOT DETECTED
Cocaine: POSITIVE — AB
Opiates: POSITIVE — AB
Tetrahydrocannabinol: NOT DETECTED

## 2024-02-21 MED ORDER — SODIUM CHLORIDE 0.9 % IV SOLN: INTRAVENOUS | Status: DC

## 2024-02-21 NOTE — Progress Notes (Signed)
 PROGRESS NOTE        PATIENT DETAILS Name: Sylvia Arellano Age: 53 y.o. Sex: female Date of Birth: [birth year] Admit Date: 02/19/2024 Admitting Physician Evalene GORMAN Sprinkles, MD ERE:Ejupzwu, No Pcp Per  Brief Summary: Patient is a 53 y.o.  female with history of EtOH/cocaine use-presented with upper abdominal pain-found to have acute pancreatitis.  Significant events: 10/2>> admit to [facility]  Significant studies: 10/1>> RUQ ultrasound: No acute abnormalities-no cholelithiasis 10/1>> CT abdomen/pelvis: No acute findings-fibroid uterus. 10/1>> lipase: 638 10/2>> triglycerides: 42  Significant microbiology data: None  Procedures: None  Consults: None  Subjective: Feels better-abdominal pain is improved-tolerating clear liquids-wants to try regular food.  Complains of some vaginal discharge-not sexually active for 6 years.  Objective: Vitals: Blood pressure 122/81, pulse 63, temperature 98.7 F (37.1 C), temperature source Oral, resp. rate (!) 23, height 5' 2 (1.575 m), weight 58.8 kg, SpO2 96%.   Exam: Gen Exam:Alert awake-not in any distress HEENT:atraumatic, normocephalic Chest: B/L clear to auscultation anteriorly CVS:S1S2 regular Abdomen:soft non tender, non distended Extremities:no edema Neurology: Non focal Skin: no rash  Pertinent Labs/Radiology:    Latest Ref Rng & Units 02/21/2024    3:02 AM 02/20/2024   10:04 AM 02/19/2024    6:14 PM  CBC  WBC 4.0 - 10.5 K/uL 5.1  4.0  4.9   Hemoglobin 12.0 - 15.0 g/dL 9.2  9.3  89.6   Hematocrit 36.0 - 46.0 % 28.3  29.0  31.7   Platelets 150 - 400 K/uL 230  187  232     Lab Results  Component Value Date   NA 138 02/21/2024   K 4.0 02/21/2024   CL 107 02/21/2024   CO2 24 02/21/2024      Assessment/Plan: Acute alcoholic pancreatitis No cholelithiasis seen on imaging studies Triglycerides within normal limits Clinically improved-less pain (but still requiring fentanyl injection this  morning)-advance to full liquids and see how she does Continue as needed antiemetics/narcotics Counseled regarding importance of abstaining from further alcohol use.  EtOH use Drinks sixpack of beer at least 3-4 times a week No withdrawal symptoms Continue Ativan  per CIWA protocol  Cocaine use Binges periodically on cocaine Counseled regarding quitting On as needed Ativan   Possible vaginitis Complains of vaginal discharge for the past few days Not sexually active x 6 years Check wet prep-GC probe-and treat based on wet prep results. HIV nonreactive  Normocytic anemia Probably secondary to acute illness No indication of blood loss Anemia is mild-stable for follow-up.  History of depression/anxiety Appears stable Continue fluoxetine /trazodone  As needed Vistaril  for anxiety  History of TIA Appears stable-no recurrence  Code status:   Code Status: Full Code   DVT Prophylaxis: enoxaparin (LOVENOX) injection 40 mg Start: 02/20/24 1000  Family Communication: None at bedside   Disposition Plan: Status is: Observation The patient will require care spanning > 2 midnights and should be moved to inpatient because: Severity of illness   Planned Discharge Destination:Home   Diet: Diet Order             Diet full liquid Room service appropriate? Yes; Fluid consistency: Thin  Diet effective now                     Antimicrobial agents: Anti-infectives (From admission, onward)    None        MEDICATIONS: Scheduled Meds:  enoxaparin (LOVENOX) injection  40 mg Subcutaneous Q24H   FLUoxetine   20 mg Oral q AM   folic acid   1 mg Oral Daily   multivitamin with minerals  1 tablet Oral Daily   pantoprazole (PROTONIX) IV  40 mg Intravenous Q12H   thiamine   100 mg Oral Daily   Or   thiamine   100 mg Intravenous Daily   traZODone   50 mg Oral QHS   Continuous Infusions: PRN Meds:.acetaminophen , fentaNYL (SUBLIMAZE) injection, hydrOXYzine , LORazepam  **OR**  LORazepam , oxyCODONE, prochlorperazine   I have personally reviewed following labs and imaging studies  LABORATORY DATA: CBC: Recent Labs  Lab 02/19/24 1814 02/20/24 1004 02/21/24 0302  WBC 4.9 4.0 5.1  NEUTROABS  --  2.2  --   HGB 10.3* 9.3* 9.2*  HCT 31.7* 29.0* 28.3*  MCV 82.8 82.6 81.3  PLT 232 187 230    Basic Metabolic Panel: Recent Labs  Lab 02/19/24 1814 02/20/24 1004 02/21/24 0302  NA 137 136 138  K 3.4* 4.0 4.0  CL 102 110 107  CO2 23 19* 24  GLUCOSE 172* 104* 100*  BUN 11 8 <5*  CREATININE 0.82 0.60 0.71  CALCIUM  9.2 7.4* 8.4*    GFR: Estimated Creatinine Clearance: 66.5 mL/min (by C-G formula based on SCr of 0.71 mg/dL).  Liver Function Tests: Recent Labs  Lab 02/19/24 1814 02/20/24 1004 02/21/24 0302  AST 27 20 19   ALT 15 15 14   ALKPHOS 51 40 41  BILITOT 0.4 0.3 0.2  PROT 7.1 5.4* 6.3*  ALBUMIN 3.1* 2.4* 2.6*   Recent Labs  Lab 02/19/24 1814  LIPASE 638*   No results for input(s): AMMONIA in the last 168 hours.  Coagulation Profile: No results for input(s): INR, PROTIME in the last 168 hours.  Cardiac Enzymes: No results for input(s): CKTOTAL, CKMB, CKMBINDEX, TROPONINI in the last 168 hours.  BNP (last 3 results) No results for input(s): PROBNP in the last 8760 hours.  Lipid Profile: Recent Labs    02/20/24 1005  TRIG 42    Thyroid  Function Tests: No results for input(s): TSH, T4TOTAL, FREET4, T3FREE, THYROIDAB in the last 72 hours.  Anemia Panel: No results for input(s): VITAMINB12, FOLATE, FERRITIN, TIBC, IRON, RETICCTPCT in the last 72 hours.  Urine analysis:    Component Value Date/Time   COLORURINE YELLOW 02/19/2024 0003   APPEARANCEUR HAZY (A) 02/19/2024 0003   LABSPEC 1.023 02/19/2024 0003   PHURINE 5.0 02/19/2024 0003   GLUCOSEU NEGATIVE 02/19/2024 0003   HGBUR SMALL (A) 02/19/2024 0003   BILIRUBINUR NEGATIVE 02/19/2024 0003   KETONESUR NEGATIVE 02/19/2024 0003    PROTEINUR 30 (A) 02/19/2024 0003   UROBILINOGEN 0.2 07/09/2014 1347   NITRITE NEGATIVE 02/19/2024 0003   LEUKOCYTESUR MODERATE (A) 02/19/2024 0003    Sepsis Labs: Lactic Acid, Venous    Component Value Date/Time   LATICACIDVEN 0.9 07/10/2014 1601    MICROBIOLOGY: No results found for this or any previous visit (from the past 240 hours).  RADIOLOGY STUDIES/RESULTS: CT ABDOMEN PELVIS W CONTRAST Result Date: 02/20/2024 EXAM: CT ABDOMEN AND PELVIS WITH CONTRAST 02/20/2024 03:36:20 AM TECHNIQUE: CT of the abdomen and pelvis was performed with the administration of 75 mL iohexol (OMNIPAQUE) 350 MG/ML injection. Multiplanar reformatted images are provided for review. Automated exposure control, iterative reconstruction, and/or weight-based adjustment of the mA/kV was utilized to reduce the radiation dose to as low as reasonably achievable. COMPARISON: None available. CLINICAL HISTORY: Pancreatitis, acute, severe. Patient reports fatigue, abdominal pain, and decreased appetite since Sunday. Patient  is alert and oriented x4. FINDINGS: LOWER CHEST: No acute abnormality. LIVER: The liver is unremarkable. GALLBLADDER AND BILE DUCTS: Gallbladder is unremarkable. No biliary ductal dilatation. SPLEEN: No acute abnormality. PANCREAS: No acute abnormality. ADRENAL GLANDS: No acute abnormality. KIDNEYS, URETERS AND BLADDER: No stones in the kidneys or ureters. No hydronephrosis. No perinephric or periureteral stranding. Urinary bladder is unremarkable. GI AND BOWEL: Stomach demonstrates no acute abnormality. There is no bowel obstruction. Normal appendix. PERITONEUM AND RETROPERITONEUM: Small amount of free fluid in the pelvis. No free air. VASCULATURE: Aorta is normal in caliber. LYMPH NODES: No lymphadenopathy. REPRODUCTIVE ORGANS: Multiple fibroids in the uterus. The largest is in the uterine fundus and measures approximately 4.4 cm. BONES AND SOFT TISSUES: No acute osseous abnormality. No focal soft tissue  abnormality. IMPRESSION: 1. No acute findings in the abdomen or pelvis. 2. Fibroid uterus. Electronically signed by: Norman Gatlin MD 02/20/2024 03:43 AM EDT RP Workstation: [id number]   US  Abdomen Limited RUQ (LIVER/GB) Result Date: 02/20/2024 EXAM: Right Upper Quadrant Abdominal Ultrasound 02/20/2024 01:42:42 AM TECHNIQUE: Real-time ultrasonography of the right upper quadrant of the abdomen was performed. COMPARISON: None available. CLINICAL HISTORY: RUQ pain 151471. RUQ pain. FINDINGS: LIVER: The liver demonstrates normal echogenicity. No intrahepatic biliary ductal dilatation. No evidence of mass. Patent portal vein with antegrade flow. BILIARY SYSTEM: No pericholecystic fluid or wall thickening. No cholelithiasis. Negative sonographic Murphy's sign. Common bile duct is within normal limits measuring 5 mm. OTHER: No right upper quadrant ascites. IMPRESSION: 1. No acute abnormalities. Electronically signed by: Norman Gatlin MD 02/20/2024 01:54 AM EDT RP Workstation: [id number]     LOS: 0 days   Donalda Applebaum, MD  [facility]    To contact the attending provider between 7A-7P or the covering provider during after hours 7P-7A, please log into the web site [email or web address] and access using universal [place] password for that web site. If you do not have the password, please call the hospital operator.  02/21/2024, 9:56 AM

## 2024-02-21 NOTE — Plan of Care (Signed)

## 2024-02-22 DIAGNOSIS — K859 Acute pancreatitis without necrosis or infection, unspecified: Secondary | ICD-10-CM | POA: Diagnosis not present

## 2024-02-22 LAB — COMPREHENSIVE METABOLIC PANEL WITH GFR
ALT: 14 U/L (ref 0–44)
AST: 18 U/L (ref 15–41)
Albumin: 2.9 g/dL — ABNORMAL LOW (ref 3.5–5.0)
Alkaline Phosphatase: 47 U/L (ref 38–126)
Anion gap: 13 (ref 5–15)
BUN: <5 mg/dL — ABNORMAL LOW (ref 6–20)
CO2: 22 mmol/L (ref 22–32)
Calcium: 9 mg/dL (ref 8.9–10.3)
Chloride: 102 mmol/L (ref 98–111)
Creatinine, Ser: 0.72 mg/dL (ref 0.44–1.00)
GFR, Estimated: >60 mL/min (ref >60)
Glucose, Bld: 98 mg/dL (ref 70–99)
Potassium: 4.1 mmol/L (ref 3.5–5.1)
Sodium: 137 mmol/L (ref 135–145)
Total Bilirubin: 0.2 mg/dL (ref 0.0–1.2)
Total Protein: 6.7 g/dL (ref 6.5–8.1)

## 2024-02-22 LAB — CBC WITH DIFFERENTIAL/PLATELET
Abs Immature Granulocytes: 0.02 K/uL (ref 0.00–0.07)
Basophils Absolute: 0.1 K/uL (ref 0.0–0.1)
Basophils Relative: 1 %
Eosinophils Absolute: 0.1 K/uL (ref 0.0–0.5)
Eosinophils Relative: 2 %
HCT: 34.4 % — ABNORMAL LOW (ref 36.0–46.0)
Hemoglobin: 10.7 g/dL — ABNORMAL LOW (ref 12.0–15.0)
Immature Granulocytes: 0 %
Lymphocytes Relative: 25 %
Lymphs Abs: 1.2 K/uL (ref 0.7–4.0)
MCH: 26.8 pg (ref 26.0–34.0)
MCHC: 31.1 g/dL (ref 30.0–36.0)
MCV: 86 fL (ref 80.0–100.0)
Monocytes Absolute: 0.4 K/uL (ref 0.1–1.0)
Monocytes Relative: 8 %
Neutro Abs: 3.1 K/uL (ref 1.7–7.7)
Neutrophils Relative %: 64 %
Platelets: 232 K/uL (ref 150–400)
RBC: 4 MIL/uL (ref 3.87–5.11)
RDW: 12.7 % (ref 11.5–15.5)
Smear Review: NORMAL
WBC: 5 K/uL (ref 4.0–10.5)
nRBC: 0 % (ref 0.0–0.2)

## 2024-02-22 LAB — WET PREP, GENITAL
Clue Cells Wet Prep HPF POC: NONE SEEN
Sperm: NONE SEEN
WBC, Wet Prep HPF POC: <10 (ref <10)
Yeast Wet Prep HPF POC: NONE SEEN

## 2024-02-22 LAB — PROTIME-INR
INR: 1 (ref 0.8–1.2)
Prothrombin Time: 14 s (ref 11.4–15.2)

## 2024-02-22 LAB — MAGNESIUM: Magnesium: 1.7 mg/dL (ref 1.7–2.4)

## 2024-02-22 LAB — LIPASE, BLOOD: Lipase: 440 U/L — ABNORMAL HIGH (ref 11–51)

## 2024-02-22 LAB — PHOSPHORUS: Phosphorus: 4 mg/dL (ref 2.5–4.6)

## 2024-02-22 MED ORDER — SODIUM CHLORIDE 0.9 % IV SOLN: INTRAVENOUS | Status: DC

## 2024-02-22 MED ORDER — PANTOPRAZOLE SODIUM 40 MG PO TBEC
40.0000 mg | DELAYED_RELEASE_TABLET | Freq: Two times a day (BID) | ORAL | Status: DC
  Administered 2024-02-22 – 2024-02-25 (×6): 40 mg via ORAL
  Filled 2024-02-22 (×6): qty 1

## 2024-02-22 MED ORDER — METRONIDAZOLE 500 MG PO TABS
500.0000 mg | ORAL_TABLET | Freq: Two times a day (BID) | ORAL | Status: DC
  Administered 2024-02-22 – 2024-02-25 (×6): 500 mg via ORAL
  Filled 2024-02-22 (×6): qty 1

## 2024-02-22 NOTE — Plan of Care (Signed)

## 2024-02-22 NOTE — Progress Notes (Signed)
 PROGRESS NOTE        PATIENT DETAILS Name: Sylvia Arellano Age: 53 y.o. Sex: female Date of Birth: 09-30-1970 Admit Date: 02/19/2024 Admitting Physician Donalda CHRISTELLA Applebaum, MD ERE:Ejupzwu, No Pcp Per  Brief Summary: Patient is a 53 y.o.  female with history of EtOH/cocaine use-presented with upper abdominal pain-found to have acute pancreatitis.  Significant events: 10/2>> admit to [facility]  Significant studies: 10/1>> RUQ ultrasound: No acute abnormalities-no cholelithiasis 10/1>> CT abdomen/pelvis: No acute findings-fibroid uterus. 10/1>> lipase: 638 10/2>> triglycerides: 42  Significant microbiology data: None  Procedures: None  Consults: None  Subjective: Patient in bed, appears comfortable, denies any headache, no fever, no chest pain or pressure, no shortness of breath , improved epigastric abdominal pain. No focal weakness.  Objective: Vitals: Blood pressure 120/85, pulse 75, temperature 99.5 F (37.5 C), temperature source Oral, resp. rate 20, height 5' 2 (1.575 m), weight 58.8 kg, SpO2 95%.   Exam:  Awake Alert, No new F.N deficits, Normal affect [place].AT,PERRAL Supple Neck, No JVD,   Symmetrical Chest wall movement, Good air movement bilaterally, CTAB RRR,No Gallops, Rubs or new Murmurs,  +ve B.Sounds, Abd Soft, mild epigastric tenderness,   No Cyanosis, Clubbing or edema    Assessment/Plan:  Acute alcoholic pancreatitis No cholelithiasis seen on imaging studies Triglycerides within normal limits Continue as needed antiemetics/narcotics Counseled regarding importance of abstaining from further alcohol use. Clinically much improved.  Continue to monitor.  EtOH use Drinks sixpack of beer at least 3-4 times a week No withdrawal symptoms Continue Ativan  per CIWA protocol  Cocaine use Binges periodically on cocaine Counseled regarding quitting On as needed Ativan   Possible vaginitis Complains of vaginal discharge for the past  few days Not sexually active x 6 years Check wet prep-GC probe-and treat based on wet prep results.  Call for results stable. HIV nonreactive  Normocytic anemia Probably secondary to acute illness No indication of blood loss Anemia is mild-stable for follow-up.  History of depression/anxiety Appears stable Continue fluoxetine /trazodone  As needed Vistaril  for anxiety  History of TIA Appears stable-no recurrence  Code status:   Code Status: Full Code   DVT Prophylaxis: enoxaparin (LOVENOX) injection 40 mg Start: 02/20/24 1000  Family Communication: None at bedside   Disposition Plan: Status is: Observation The patient will require care spanning > 2 midnights and should be moved to inpatient because: Severity of illness   Planned Discharge Destination:Home   Diet: Diet Order             Diet full liquid Room service appropriate? Yes; Fluid consistency: Thin  Diet effective now                    Data Review:   Patient Lines/Drains/Airways Status     Active Line/Drains/Airways     Name Placement date Placement time Site Days   Peripheral IV 02/21/24 22 G 1.75 Left;Lateral Forearm 02/21/24  2013  Forearm  1             Inpatient Medications  Scheduled Meds:  enoxaparin (LOVENOX) injection  40 mg Subcutaneous Q24H   FLUoxetine   20 mg Oral q AM   folic acid   1 mg Oral Daily   multivitamin with minerals  1 tablet Oral Daily   pantoprazole (PROTONIX) IV  40 mg Intravenous Q12H   thiamine   100 mg Oral Daily  traZODone   50 mg Oral QHS   Continuous Infusions:  sodium chloride      PRN Meds:.acetaminophen , fentaNYL (SUBLIMAZE) injection, hydrOXYzine , LORazepam  **OR** LORazepam , oxyCODONE, prochlorperazine  DVT Prophylaxis  enoxaparin (LOVENOX) injection 40 mg Start: 02/20/24 1000   Recent Labs  Lab 02/19/24 1814 02/20/24 1004 02/21/24 0302 02/22/24 0550  WBC 4.9 4.0 5.1 5.0  HGB 10.3* 9.3* 9.2* 10.7*  HCT 31.7* 29.0* 28.3* 34.4*  PLT 232  187 230 232  MCV 82.8 82.6 81.3 86.0  MCH 26.9 26.5 26.4 26.8  MCHC 32.5 32.1 32.5 31.1  RDW 13.0 13.1 13.1 12.7  LYMPHSABS  --  1.6  --  1.2  MONOABS  --  0.1  --  0.4  EOSABS  --  0.1  --  0.1  BASOSABS  --  0.0  --  0.1    Recent Labs  Lab 02/19/24 1814 02/20/24 1004 02/21/24 0302 02/22/24 0550  NA 137 136 138 137  K 3.4* 4.0 4.0 4.1  CL 102 110 107 102  CO2 23 19* 24 22  ANIONGAP 12 7 7 13   GLUCOSE 172* 104* 100* 98  BUN 11 8 <5* <5*  CREATININE 0.82 0.60 0.71 0.72  AST 27 20 19 18   ALT 15 15 14 14   ALKPHOS 51 40 41 47  BILITOT 0.4 0.3 0.2 0.2  ALBUMIN 3.1* 2.4* 2.6* 2.9*  INR  --   --   --  1.0  MG  --   --   --  1.7  PHOS  --   --   --  4.0  CALCIUM  9.2 7.4* 8.4* 9.0      Recent Labs  Lab 02/19/24 1814 02/20/24 1004 02/21/24 0302 02/22/24 0550  INR  --   --   --  1.0  MG  --   --   --  1.7  CALCIUM  9.2 7.4* 8.4* 9.0    --------------------------------------------------------------------------------------------------------------- Lab Results  Component Value Date   CHOL 251 (H) 12/07/2020   HDL 95 12/07/2020   LDLCALC 122 (H) 12/07/2020   TRIG 42 02/20/2024   CHOLHDL 2.6 12/07/2020    Lab Results  Component Value Date   HGBA1C 6.0 (H) 12/07/2020      Micro Results Recent Results (from the past 240 hours)  Wet prep, genital     Status: Abnormal   Collection Time: 02/22/24  6:13 AM   Specimen: Vaginal  Result Value Ref Range Status   Yeast Wet Prep HPF POC NONE SEEN NONE SEEN Final   Trich, Wet Prep PRESENT (A) NONE SEEN Final   Clue Cells Wet Prep HPF POC NONE SEEN NONE SEEN Final   WBC, Wet Prep HPF POC <10 <10 Final   Sperm NONE SEEN  Final    Comment: Performed at [facility], [address] [place], [place] [address]    Radiology Reports  No results found.    Signature  -   Lavada Stank M.D on 02/22/2024 at 10:36 AM   -  To page go to [email or web address]

## 2024-02-22 NOTE — Plan of Care (Signed)
  Problem: Health Behavior/Discharge Planning: Goal: Ability to manage health-related needs will improve Outcome: Progressing   Problem: Clinical Measurements: Goal: Will remain free from infection Outcome: Progressing Goal: Diagnostic test results will improve Outcome: Progressing   Problem: Activity: Goal: Risk for activity intolerance will decrease Outcome: Progressing   Problem: Pain Managment: Goal: General experience of comfort will improve and/or be controlled Outcome: Progressing

## 2024-02-23 DIAGNOSIS — K859 Acute pancreatitis without necrosis or infection, unspecified: Secondary | ICD-10-CM | POA: Diagnosis not present

## 2024-02-23 LAB — CBC WITH DIFFERENTIAL/PLATELET
Abs Immature Granulocytes: 0.01 K/uL (ref 0.00–0.07)
Basophils Absolute: 0 K/uL (ref 0.0–0.1)
Basophils Relative: 0 %
Eosinophils Absolute: 0.1 K/uL (ref 0.0–0.5)
Eosinophils Relative: 2 %
HCT: 30.2 % — ABNORMAL LOW (ref 36.0–46.0)
Hemoglobin: 9.8 g/dL — ABNORMAL LOW (ref 12.0–15.0)
Immature Granulocytes: 0 %
Lymphocytes Relative: 28 %
Lymphs Abs: 1.3 K/uL (ref 0.7–4.0)
MCH: 26.6 pg (ref 26.0–34.0)
MCHC: 32.5 g/dL (ref 30.0–36.0)
MCV: 81.8 fL (ref 80.0–100.0)
Monocytes Absolute: 0.5 K/uL (ref 0.1–1.0)
Monocytes Relative: 11 %
Neutro Abs: 2.7 K/uL (ref 1.7–7.7)
Neutrophils Relative %: 59 %
Platelets: 217 K/uL (ref 150–400)
RBC: 3.69 MIL/uL — ABNORMAL LOW (ref 3.87–5.11)
RDW: 12.5 % (ref 11.5–15.5)
Smear Review: NORMAL
WBC: 4.5 K/uL (ref 4.0–10.5)
nRBC: 0 % (ref 0.0–0.2)

## 2024-02-23 LAB — COMPREHENSIVE METABOLIC PANEL WITH GFR
ALT: 13 U/L (ref 0–44)
AST: 16 U/L (ref 15–41)
Albumin: 2.6 g/dL — ABNORMAL LOW (ref 3.5–5.0)
Alkaline Phosphatase: 44 U/L (ref 38–126)
Anion gap: 7 (ref 5–15)
BUN: 5 mg/dL — ABNORMAL LOW (ref 6–20)
CO2: 26 mmol/L (ref 22–32)
Calcium: 8.7 mg/dL — ABNORMAL LOW (ref 8.9–10.3)
Chloride: 104 mmol/L (ref 98–111)
Creatinine, Ser: 0.71 mg/dL (ref 0.44–1.00)
GFR, Estimated: >60 mL/min (ref >60)
Glucose, Bld: 111 mg/dL — ABNORMAL HIGH (ref 70–99)
Potassium: 4.3 mmol/L (ref 3.5–5.1)
Sodium: 137 mmol/L (ref 135–145)
Total Bilirubin: 0.3 mg/dL (ref 0.0–1.2)
Total Protein: 6.7 g/dL (ref 6.5–8.1)

## 2024-02-23 LAB — LIPID PANEL
Cholesterol: 149 mg/dL (ref 0–200)
HDL: 32 mg/dL — ABNORMAL LOW (ref >40)
LDL Cholesterol: 96 mg/dL (ref 0–99)
Total CHOL/HDL Ratio: 4.7 ratio
Triglycerides: 103 mg/dL (ref <150)
VLDL: 21 mg/dL (ref 0–40)

## 2024-02-23 LAB — HEPATITIS PANEL, ACUTE
HCV Ab: NONREACTIVE
Hep A IgM: NONREACTIVE
Hep B C IgM: NONREACTIVE
Hepatitis B Surface Ag: NONREACTIVE

## 2024-02-23 LAB — PHOSPHORUS: Phosphorus: 3.8 mg/dL (ref 2.5–4.6)

## 2024-02-23 LAB — MAGNESIUM: Magnesium: 1.7 mg/dL (ref 1.7–2.4)

## 2024-02-23 MED ORDER — OXYCODONE HCL 5 MG PO TABS
5.0000 mg | ORAL_TABLET | Freq: Four times a day (QID) | ORAL | Status: DC | PRN
  Administered 2024-02-23 – 2024-02-24 (×4): 5 mg via ORAL
  Filled 2024-02-23 (×4): qty 1

## 2024-02-23 MED ORDER — SODIUM CHLORIDE 0.9 % IV SOLN: INTRAVENOUS | Status: DC

## 2024-02-23 MED ORDER — FENTANYL CITRATE PF 50 MCG/ML IJ SOSY
25.0000 ug | PREFILLED_SYRINGE | INTRAMUSCULAR | Status: DC | PRN
  Administered 2024-02-23 (×3): 25 ug via INTRAVENOUS
  Filled 2024-02-23 (×3): qty 1

## 2024-02-23 MED ORDER — INFLUENZA VIRUS VACC SPLIT PF (FLUZONE) 0.5 ML IM SUSY: 0.5000 mL | PREFILLED_SYRINGE | INTRAMUSCULAR | Status: DC

## 2024-02-23 NOTE — Progress Notes (Signed)
 PROGRESS NOTE        PATIENT DETAILS Name: Sylvia Arellano Age: 53 y.o. Sex: female Date of Birth: [birth year] Admit Date: 02/19/2024 Admitting Physician Donalda CHRISTELLA Applebaum, MD ERE:Ejupzwu, No Pcp Per  Brief Summary: Patient is a 53 y.o.  female with history of EtOH/cocaine use-presented with upper abdominal pain-found to have acute pancreatitis.  Significant events: 10/2>> admit to [facility]  Significant studies: 10/1>> RUQ ultrasound: No acute abnormalities-no cholelithiasis 10/1>> CT abdomen/pelvis: No acute findings-fibroid uterus. 10/1>> lipase: 638 10/2>> triglycerides: 42  Significant microbiology data: None  Procedures: None  Consults: None  Subjective:  Patient in bed, appears comfortable, denies any headache, no fever, no chest pain or pressure, no shortness of breath , difficultly improved abdominal pain. No focal weakness.  Objective: Vitals: Blood pressure 124/77, pulse (!) 57, temperature 98.2 F (36.8 C), temperature source Oral, resp. rate 18, height 5' 2 (1.575 m), weight 58.8 kg, SpO2 95%.   Exam:  Awake Alert, No new F.N deficits, Normal affect [place].AT,PERRAL Supple Neck, No JVD,   Symmetrical Chest wall movement, Good air movement bilaterally, CTAB RRR,No Gallops, Rubs or new Murmurs,  +ve B.Sounds, Abd Soft, minimal epigastric tenderness No Cyanosis, Clubbing or edema    Assessment/Plan:  Acute alcoholic pancreatitis No cholelithiasis seen on imaging studies Triglycerides within normal limits Continue as needed antiemetics/narcotics Counseled regarding importance of abstaining from further alcohol use. Clinically much improved.  Continue to monitor.  EtOH use Drinks sixpack of beer at least 3-4 times a week No withdrawal symptoms Continue Ativan  per CIWA protocol  Cocaine use Binges periodically on cocaine Counseled regarding quitting On as needed Ativan   Bacterial vaginitis with positive clue cells in the  specimen obtained in the ER Flagyl 500 twice daily x 7 days, HIV negative, acute hepatitis panel pending  Normocytic anemia Probably secondary to acute illness No indication of blood loss Anemia is mild-stable for follow-up.  History of depression/anxiety Appears stable Continue fluoxetine /trazodone  As needed Vistaril  for anxiety  History of TIA Appears stable-no recurrence  Code status:   Code Status: Full Code   DVT Prophylaxis: enoxaparin (LOVENOX) injection 40 mg Start: 02/20/24 1000  Family Communication: None at bedside   Disposition Plan: Status is: Observation The patient will require care spanning > 2 midnights and should be moved to inpatient because: Severity of illness   Planned Discharge Destination:Home   Diet: Diet Order             Diet full liquid Room service appropriate? Yes; Fluid consistency: Thin  Diet effective now                    Data Review:   Patient Lines/Drains/Airways Status     Active Line/Drains/Airways     Name Placement date Placement time Site Days   Peripheral IV 02/21/24 22 G 1.75 Left;Lateral Forearm 02/21/24  2013  Forearm  1             Inpatient Medications  Scheduled Meds:  enoxaparin (LOVENOX) injection  40 mg Subcutaneous Q24H   FLUoxetine   20 mg Oral q AM   folic acid   1 mg Oral Daily   metroNIDAZOLE  500 mg Oral Q12H   multivitamin with minerals  1 tablet Oral Daily   pantoprazole  40 mg Oral BID   thiamine   100 mg Oral Daily   traZODone   50 mg Oral QHS   Continuous Infusions:  sodium chloride      PRN Meds:.acetaminophen , fentaNYL (SUBLIMAZE) injection, hydrOXYzine , oxyCODONE, prochlorperazine  DVT Prophylaxis  enoxaparin (LOVENOX) injection 40 mg Start: 02/20/24 1000   Recent Labs  Lab 02/19/24 1814 02/20/24 1004 02/21/24 0302 02/22/24 0550 02/23/24 0155  WBC 4.9 4.0 5.1 5.0 4.5  HGB 10.3* 9.3* 9.2* 10.7* 9.8*  HCT 31.7* 29.0* 28.3* 34.4* 30.2*  PLT 232 187 230 232 217  MCV  82.8 82.6 81.3 86.0 81.8  MCH 26.9 26.5 26.4 26.8 26.6  MCHC 32.5 32.1 32.5 31.1 32.5  RDW 13.0 13.1 13.1 12.7 12.5  LYMPHSABS  --  1.6  --  1.2 1.3  MONOABS  --  0.1  --  0.4 0.5  EOSABS  --  0.1  --  0.1 0.1  BASOSABS  --  0.0  --  0.1 0.0    Recent Labs  Lab 02/19/24 1814 02/20/24 1004 02/21/24 0302 02/22/24 0550 02/23/24 0155  NA 137 136 138 137 137  K 3.4* 4.0 4.0 4.1 4.3  CL 102 110 107 102 104  CO2 23 19* 24 22 26   ANIONGAP 12 7 7 13 7   GLUCOSE 172* 104* 100* 98 111*  BUN 11 8 <5* <5* 5*  CREATININE 0.82 0.60 0.71 0.72 0.71  AST 27 20 19 18 16   ALT 15 15 14 14 13   ALKPHOS 51 40 41 47 44  BILITOT 0.4 0.3 0.2 0.2 0.3  ALBUMIN 3.1* 2.4* 2.6* 2.9* 2.6*  INR  --   --   --  1.0  --   MG  --   --   --  1.7 1.7  PHOS  --   --   --  4.0 3.8  CALCIUM  9.2 7.4* 8.4* 9.0 8.7*      Recent Labs  Lab 02/19/24 1814 02/20/24 1004 02/21/24 0302 02/22/24 0550 02/23/24 0155  INR  --   --   --  1.0  --   MG  --   --   --  1.7 1.7  CALCIUM  9.2 7.4* 8.4* 9.0 8.7*    --------------------------------------------------------------------------------------------------------------- Lab Results  Component Value Date   CHOL 149 02/23/2024   HDL 32 (L) 02/23/2024   LDLCALC 96 02/23/2024   TRIG 103 02/23/2024   CHOLHDL 4.7 02/23/2024    Lab Results  Component Value Date   HGBA1C 6.0 (H) 12/07/2020      Micro Results Recent Results (from the past 240 hours)  Wet prep, genital     Status: Abnormal   Collection Time: 02/22/24  6:13 AM   Specimen: Vaginal  Result Value Ref Range Status   Yeast Wet Prep HPF POC NONE SEEN NONE SEEN Final   Trich, Wet Prep PRESENT (A) NONE SEEN Final   Clue Cells Wet Prep HPF POC NONE SEEN NONE SEEN Final   WBC, Wet Prep HPF POC <10 <10 Final   Sperm NONE SEEN  Final    Comment: Performed at [facility], [address] [place], [place] [address]    Radiology Reports  No results found.    Signature  -   Lavada Stank M.D on  02/23/2024 at 9:15 AM   -  To page go to [email or web address]

## 2024-02-23 NOTE — Plan of Care (Signed)

## 2024-02-23 NOTE — Plan of Care (Signed)
  Problem: Education: Goal: Knowledge of General Education information will improve Description: Including pain rating scale, medication(s)/side effects and non-pharmacologic comfort measures Outcome: Progressing   Problem: Clinical Measurements: Goal: Will remain free from infection Outcome: Progressing   Problem: Nutrition: Goal: Adequate nutrition will be maintained Outcome: Progressing   Problem: Pain Managment: Goal: General experience of comfort will improve and/or be controlled Outcome: Progressing

## 2024-02-24 DIAGNOSIS — K859 Acute pancreatitis without necrosis or infection, unspecified: Secondary | ICD-10-CM | POA: Diagnosis not present

## 2024-02-24 LAB — CBC WITH DIFFERENTIAL/PLATELET
Basophils Absolute: 0 K/uL (ref 0.0–0.1)
Basophils Relative: 0 %
Eosinophils Absolute: 0.1 K/uL (ref 0.0–0.5)
Eosinophils Relative: 2 %
HCT: 30 % — ABNORMAL LOW (ref 36.0–46.0)
Hemoglobin: 9.8 g/dL — ABNORMAL LOW (ref 12.0–15.0)
Lymphocytes Relative: 37 %
Lymphs Abs: 1.6 K/uL (ref 0.7–4.0)
MCH: 26.7 pg (ref 26.0–34.0)
MCHC: 32.7 g/dL (ref 30.0–36.0)
MCV: 81.7 fL (ref 80.0–100.0)
Monocytes Absolute: 0.2 K/uL (ref 0.1–1.0)
Monocytes Relative: 4 %
Neutro Abs: 2.5 K/uL (ref 1.7–7.7)
Neutrophils Relative %: 57 %
Platelets: 226 K/uL (ref 150–400)
RBC: 3.67 MIL/uL — ABNORMAL LOW (ref 3.87–5.11)
RDW: 12.7 % (ref 11.5–15.5)
WBC: 4.4 K/uL (ref 4.0–10.5)
nRBC: 0 % (ref 0.0–0.2)

## 2024-02-24 LAB — GC/CHLAMYDIA PROBE AMP (~~LOC~~) NOT AT ARMC
Chlamydia: NEGATIVE
Comment: NEGATIVE
Comment: NORMAL
Neisseria Gonorrhea: NEGATIVE

## 2024-02-24 LAB — COMPREHENSIVE METABOLIC PANEL WITH GFR
ALT: 14 U/L (ref 0–44)
AST: 19 U/L (ref 15–41)
Albumin: 2.6 g/dL — ABNORMAL LOW (ref 3.5–5.0)
Alkaline Phosphatase: 42 U/L (ref 38–126)
Anion gap: 8 (ref 5–15)
BUN: 6 mg/dL (ref 6–20)
CO2: 24 mmol/L (ref 22–32)
Calcium: 8.5 mg/dL — ABNORMAL LOW (ref 8.9–10.3)
Chloride: 104 mmol/L (ref 98–111)
Creatinine, Ser: 0.83 mg/dL (ref 0.44–1.00)
GFR, Estimated: >60 mL/min (ref >60)
Glucose, Bld: 124 mg/dL — ABNORMAL HIGH (ref 70–99)
Potassium: 4.1 mmol/L (ref 3.5–5.1)
Sodium: 136 mmol/L (ref 135–145)
Total Bilirubin: <0.2 mg/dL (ref 0.0–1.2)
Total Protein: 6.4 g/dL — ABNORMAL LOW (ref 6.5–8.1)

## 2024-02-24 LAB — MAGNESIUM: Magnesium: 1.7 mg/dL (ref 1.7–2.4)

## 2024-02-24 LAB — LIPASE, BLOOD: Lipase: 281 U/L — ABNORMAL HIGH (ref 11–51)

## 2024-02-24 LAB — PHOSPHORUS: Phosphorus: 3.4 mg/dL (ref 2.5–4.6)

## 2024-02-24 MED ORDER — SIMETHICONE 40 MG/0.6ML PO SUSP
80.0000 mg | Freq: Once | ORAL | Status: AC
  Administered 2024-02-24: 80 mg via ORAL
  Filled 2024-02-24: qty 1.2

## 2024-02-24 MED ORDER — MAGNESIUM SULFATE IN D5W 1-5 GM/100ML-% IV SOLN
1.0000 g | Freq: Once | INTRAVENOUS | Status: AC
  Administered 2024-02-24: 1 g via INTRAVENOUS
  Filled 2024-02-24: qty 100

## 2024-02-24 NOTE — Progress Notes (Signed)
   02/24/24 1320  Mobility  Activity Refused and notified nurse if applicable   Mobility Specialist: Progress Note  Pt refused mobility session d/t mobilizing to nurse desk prior to MS visit - Received and left in bed with all needs met. Call bell within reach.   Virgle Boards, BS Mobility Specialist Please contact via SecureChat or Rehab office at [phone].

## 2024-02-24 NOTE — Plan of Care (Signed)

## 2024-02-24 NOTE — TOC Initial Note (Signed)
 Transition of Care [facility]) - Initial/Assessment Note    Patient Details  Name: Sylvia Arellano MRN: [id number] Date of Birth: 1970/06/08  [facility] CM/SW Contact:    Andrez JULIANNA George, RN Phone Number: 02/24/2024, 4:25 PM  Clinical Narrative:                 Sylvia Arellano is a 53 y.o. female with medical history significant of Cocaine abuse, Depression, ETOH abuse, ? Seizures presenting with pancreatitis, ETOH and cocaine abuse.  Pt stays between her friends place and her moms.  She drives herself and manages her own medications.  No PCP: new appointment on AVS.   Pt states her friend will transport her home at d/c.   IP Care management following.  Expected Discharge Plan: Home/Self Care Barriers to Discharge: Continued Medical Work up   Patient Goals and CMS Choice            Expected Discharge Plan and Services   Discharge Planning Services: CM Consult   Living arrangements for the past 2 months: Single Family Home                                      Prior Living Arrangements/Services Living arrangements for the past 2 months: Single Family Home Lives with:: Friends Patient language and need for interpreter reviewed:: Yes Do you feel safe going back to the place where you live?: Yes      Need for Family Participation in Patient Care: No (Comment) Care giver support system in place?: No (comment)   Criminal Activity/Legal Involvement Pertinent to Current Situation/Hospitalization: No - Comment as needed  Activities of Daily Living   ADL Screening (condition at time of admission) Independently performs ADLs?: Yes (appropriate for developmental age) Is the patient deaf or have difficulty hearing?: No Does the patient have difficulty seeing, even when wearing glasses/contacts?: No Does the patient have difficulty concentrating, remembering, or making decisions?: No  Permission Sought/Granted                  Emotional  Assessment Appearance:: Appears stated age Attitude/Demeanor/Rapport: Engaged Affect (typically observed): Accepting Orientation: : Oriented to Self, Oriented to Place, Oriented to  Time, Oriented to Situation Alcohol / Substance Use: Alcohol Use, Illicit Drugs Psych Involvement: No (comment)  Admission diagnosis:  Acute pancreatitis [K85.90] Acute pancreatitis without infection or necrosis, unspecified pancreatitis type [K85.90] Pancreatitis [K85.90] Patient Active Problem List   Diagnosis Date Noted   Pancreatitis 02/21/2024   Acute pancreatitis 02/20/2024   MDD (major depressive disorder), recurrent episode, moderate (HCC) 09/26/2023   PTSD (post-traumatic stress disorder) 09/26/2023   Drug-induced bipolar disorder (HCC)    Generalized anxiety disorder 01/17/2018   Personality disorder, unspecified (HCC) 01/17/2018   Cocaine use disorder in remission 01/17/2018   Encephalopathy, hypertensive    TIA (transient ischemic attack) 07/09/2014   Hypertensive urgency 07/09/2014   Alcohol use disorder in remission 07/09/2014   History of seizure 07/09/2014   Hypokalemia 07/09/2014   Cerebral infarction [facility])    PCP:  Patient, No Pcp Per Pharmacy:   [facility] [phone] - [place], [place] - [address] AT [facility] [address] [place] [address] Phone: [phone] Fax: [phone]     Social Drivers of Health (SDOH) Social History: SDOH Screenings   Food Insecurity: No Food Insecurity (02/20/2024)  Housing: Low  Risk  (02/20/2024)  Transportation Needs: No Transportation Needs (02/20/2024)  Utilities: Not At Risk (02/20/2024)  Alcohol Screen: Low Risk  (05/27/2020)  Depression (PHQ2-9): Medium Risk (05/26/2020)  Tobacco Use: Medium Risk (02/19/2024)   SDOH Interventions:     Readmission Risk Interventions     No data to display

## 2024-02-24 NOTE — TOC CAGE-AID Note (Signed)
 Transition of Care [facility]) - CAGE-AID Screening   Patient Details  Name: Sylvia Arellano MRN: [id number] Date of Birth: [birth year]  [facility]) CM/SW Contact:    Andrez JULIANNA George, RN Phone Number: 02/24/2024, 4:05 PM   Clinical Narrative: Pt denied the need for inpatient/ outpatient counseling resources.   CAGE-AID Screening:               Substance Abuse Education Offered: Yes (pt refused)

## 2024-02-24 NOTE — Plan of Care (Signed)
  Problem: Education: Goal: Knowledge of General Education information will improve Description: Including pain rating scale, medication(s)/side effects and non-pharmacologic comfort measures 02/24/2024 1901 by Drew Taffy SAUNDERS, RN Outcome: Progressing 02/24/2024 1901 by Drew Taffy SAUNDERS, RN Outcome: Progressing   Problem: Health Behavior/Discharge Planning: Goal: Ability to manage health-related needs will improve 02/24/2024 1901 by Drew Taffy SAUNDERS, RN Outcome: Progressing 02/24/2024 1901 by Drew Taffy SAUNDERS, RN Outcome: Progressing   Problem: Clinical Measurements: Goal: Ability to maintain clinical measurements within normal limits will improve 02/24/2024 1901 by Drew Taffy SAUNDERS, RN Outcome: Progressing 02/24/2024 1901 by Drew Taffy SAUNDERS, RN Outcome: Progressing Goal: Will remain free from infection 02/24/2024 1901 by Drew Taffy SAUNDERS, RN Outcome: Progressing 02/24/2024 1901 by Drew Taffy SAUNDERS, RN Outcome: Progressing Goal: Diagnostic test results will improve 02/24/2024 1901 by Drew Taffy SAUNDERS, RN Outcome: Progressing 02/24/2024 1901 by Drew Taffy SAUNDERS, RN Outcome: Progressing Goal: Respiratory complications will improve 02/24/2024 1901 by Drew Taffy SAUNDERS, RN Outcome: Progressing 02/24/2024 1901 by Drew Taffy SAUNDERS, RN Outcome: Progressing Goal: Cardiovascular complication will be avoided 02/24/2024 1901 by Drew Taffy SAUNDERS, RN Outcome: Progressing 02/24/2024 1901 by Drew Taffy SAUNDERS, RN Outcome: Progressing   Problem: Activity: Goal: Risk for activity intolerance will decrease 02/24/2024 1901 by Drew Taffy SAUNDERS, RN Outcome: Progressing 02/24/2024 1901 by Drew Taffy SAUNDERS, RN Outcome: Progressing   Problem: Nutrition: Goal: Adequate nutrition will be maintained 02/24/2024 1901 by Drew Taffy SAUNDERS, RN Outcome: Progressing 02/24/2024 1901 by Drew Taffy SAUNDERS, RN Outcome: Progressing   Problem: Coping: Goal: Level  of anxiety will decrease 02/24/2024 1901 by Drew Taffy SAUNDERS, RN Outcome: Progressing 02/24/2024 1901 by Drew Taffy SAUNDERS, RN Outcome: Progressing   Problem: Elimination: Goal: Will not experience complications related to bowel motility 02/24/2024 1901 by Drew Taffy SAUNDERS, RN Outcome: Progressing 02/24/2024 1901 by Drew Taffy SAUNDERS, RN Outcome: Progressing Goal: Will not experience complications related to urinary retention 02/24/2024 1901 by Drew Taffy SAUNDERS, RN Outcome: Progressing 02/24/2024 1901 by Drew Taffy SAUNDERS, RN Outcome: Progressing   Problem: Pain Managment: Goal: General experience of comfort will improve and/or be controlled 02/24/2024 1901 by Drew Taffy SAUNDERS, RN Outcome: Progressing 02/24/2024 1901 by Drew Taffy SAUNDERS, RN Outcome: Progressing   Problem: Safety: Goal: Ability to remain free from injury will improve 02/24/2024 1901 by Drew Taffy SAUNDERS, RN Outcome: Progressing 02/24/2024 1901 by Drew Taffy SAUNDERS, RN Outcome: Progressing   Problem: Skin Integrity: Goal: Risk for impaired skin integrity will decrease 02/24/2024 1901 by Drew Taffy SAUNDERS, RN Outcome: Progressing 02/24/2024 1901 by Drew Taffy SAUNDERS, RN Outcome: Progressing

## 2024-02-24 NOTE — Progress Notes (Signed)
 PROGRESS NOTE        PATIENT DETAILS Name: Sylvia Arellano Age: 53 y.o. Sex: female Date of Birth: 07/12/1970 Admit Date: 02/19/2024 Admitting Physician Donalda CHRISTELLA Applebaum, MD ERE:[id number], No Pcp Per  Brief Summary: Patient is a 53 y.o.  female with history of EtOH/cocaine use-presented with upper abdominal pain-found to have acute pancreatitis.  Significant events: 10/2>> admit to [facility]  Significant studies: 10/1>> RUQ ultrasound: No acute abnormalities-no cholelithiasis 10/1>> CT abdomen/pelvis: No acute findings-fibroid uterus. 10/1>> lipase: 638 10/2>> triglycerides: 42  Significant microbiology data: None  Procedures: None  Consults: None  Subjective: Patient in bed, appears comfortable, denies any headache, no fever, no chest pain or pressure, no shortness of breath , no abdominal pain. No focal weakness.  Objective: Vitals: Blood pressure 123/82, pulse 60, temperature 98.7 F (37.1 C), temperature source Oral, resp. rate 17, height 5' 2 (1.575 m), weight 58.8 kg, SpO2 96%.   Exam:  Awake Alert, No new F.N deficits, Normal affect [place].AT,PERRAL Supple Neck, No JVD,   Symmetrical Chest wall movement, Good air movement bilaterally, CTAB RRR,No Gallops, Rubs or new Murmurs,  +ve B.Sounds, Abd Soft, minimal epigastric tenderness No Cyanosis, Clubbing or edema   Assessment/Plan:  Acute alcoholic pancreatitis No cholelithiasis seen on imaging studies Triglycerides within normal limits Continue as needed antiemetics/narcotics Counseled regarding importance of abstaining from further alcohol use. Clinically much improved, advance to soft diet on 02/24/2024 and monitor.  EtOH use Drinks sixpack of beer at least 3-4 times a week No withdrawal symptoms Continue Ativan  per CIWA protocol  Cocaine use Binges periodically on cocaine Counseled regarding quitting On as needed Ativan   Bacterial vaginitis with positive clue cells in the  specimen obtained in the ER Flagyl 500 twice daily x 7 days, HIV negative, acute hepatitis panel negative  Normocytic anemia Probably secondary to acute illness No indication of blood loss Anemia is mild-stable for follow-up.  History of depression/anxiety Appears stable Continue fluoxetine /trazodone  As needed Vistaril  for anxiety  History of TIA Appears stable-no recurrence  Code status:   Code Status: Full Code   DVT Prophylaxis: enoxaparin (LOVENOX) injection 40 mg Start: 02/20/24 1000  Family Communication: None at bedside   Disposition Plan: Status is: Observation The patient will require care spanning > 2 midnights and should be moved to inpatient because: Severity of illness   Planned Discharge Destination:Home   Diet: Diet Order             DIET SOFT Fluid consistency: Thin; Fluid restriction: 1800 mL Fluid  Diet effective now                    Data Review:   Patient Lines/Drains/Airways Status     Active Line/Drains/Airways     Name Placement date Placement time Site Days   Peripheral IV 02/21/24 22 G 1.75 Left;Lateral Forearm 02/21/24  2013  Forearm  1             Inpatient Medications  Scheduled Meds:  enoxaparin (LOVENOX) injection  40 mg Subcutaneous Q24H   FLUoxetine   20 mg Oral q AM   folic acid   1 mg Oral Daily   influenza vac split trivalent PF  0.5 mL Intramuscular Tomorrow-1000   metroNIDAZOLE  500 mg Oral Q12H   multivitamin with minerals  1 tablet Oral Daily   pantoprazole  40 mg Oral  BID   thiamine   100 mg Oral Daily   traZODone   50 mg Oral QHS   Continuous Infusions:   PRN Meds:.acetaminophen , fentaNYL (SUBLIMAZE) injection, hydrOXYzine , oxyCODONE, prochlorperazine  DVT Prophylaxis  enoxaparin (LOVENOX) injection 40 mg Start: 02/20/24 1000   Recent Labs  Lab 02/20/24 1004 02/21/24 0302 02/22/24 0550 02/23/24 0155 02/24/24 0350  WBC 4.0 5.1 5.0 4.5 4.4  HGB 9.3* 9.2* 10.7* 9.8* 9.8*  HCT 29.0* 28.3*  34.4* 30.2* 30.0*  PLT 187 230 232 217 226  MCV 82.6 81.3 86.0 81.8 81.7  MCH 26.5 26.4 26.8 26.6 26.7  MCHC 32.1 32.5 31.1 32.5 32.7  RDW 13.1 13.1 12.7 12.5 12.7  LYMPHSABS 1.6  --  1.2 1.3 1.6  MONOABS 0.1  --  0.4 0.5 0.2  EOSABS 0.1  --  0.1 0.1 0.1  BASOSABS 0.0  --  0.1 0.0 0.0    Recent Labs  Lab 02/20/24 1004 02/21/24 0302 02/22/24 0550 02/23/24 0155 02/24/24 0350  NA 136 138 137 137 136  K 4.0 4.0 4.1 4.3 4.1  CL 110 107 102 104 104  CO2 19* 24 22 26 24   ANIONGAP 7 7 13 7 8   GLUCOSE 104* 100* 98 111* 124*  BUN 8 <5* <5* 5* 6  CREATININE 0.60 0.71 0.72 0.71 0.83  AST 20 19 18 16 19   ALT 15 14 14 13 14   ALKPHOS 40 41 47 44 42  BILITOT 0.3 0.2 0.2 0.3 <0.2  ALBUMIN 2.4* 2.6* 2.9* 2.6* 2.6*  INR  --   --  1.0  --   --   MG  --   --  1.7 1.7 1.7  PHOS  --   --  4.0 3.8 3.4  CALCIUM  7.4* 8.4* 9.0 8.7* 8.5*      Recent Labs  Lab 02/20/24 1004 02/21/24 0302 02/22/24 0550 02/23/24 0155 02/24/24 0350  INR  --   --  1.0  --   --   MG  --   --  1.7 1.7 1.7  CALCIUM  7.4* 8.4* 9.0 8.7* 8.5*    --------------------------------------------------------------------------------------------------------------- Lab Results  Component Value Date   CHOL 149 02/23/2024   HDL 32 (L) 02/23/2024   LDLCALC 96 02/23/2024   TRIG 103 02/23/2024   CHOLHDL 4.7 02/23/2024    Lab Results  Component Value Date   HGBA1C 6.0 (H) 12/07/2020      Micro Results Recent Results (from the past 240 hours)  Wet prep, genital     Status: Abnormal   Collection Time: 02/22/24  6:13 AM   Specimen: Vaginal  Result Value Ref Range Status   Yeast Wet Prep HPF POC NONE SEEN NONE SEEN Final   Trich, Wet Prep PRESENT (A) NONE SEEN Final   Clue Cells Wet Prep HPF POC NONE SEEN NONE SEEN Final   WBC, Wet Prep HPF POC <10 <10 Final   Sperm NONE SEEN  Final    Comment: Performed at [facility], [address], [place], [place] [address]    Radiology Reports  No results found.     Signature  -   Lavada Stank M.D on 02/24/2024 at 8:24 AM   -  To page go to [email or web address]

## 2024-02-25 ENCOUNTER — Other Ambulatory Visit (HOSPITAL_COMMUNITY): Payer: Self-pay

## 2024-02-25 DIAGNOSIS — K859 Acute pancreatitis without necrosis or infection, unspecified: Secondary | ICD-10-CM | POA: Diagnosis not present

## 2024-02-25 LAB — MAGNESIUM: Magnesium: 1.9 mg/dL (ref 1.7–2.4)

## 2024-02-25 LAB — COMPREHENSIVE METABOLIC PANEL WITH GFR
ALT: 15 U/L (ref 0–44)
AST: 23 U/L (ref 15–41)
Albumin: 3 g/dL — ABNORMAL LOW (ref 3.5–5.0)
Alkaline Phosphatase: 44 U/L (ref 38–126)
Anion gap: 6 (ref 5–15)
BUN: 7 mg/dL (ref 6–20)
CO2: 27 mmol/L (ref 22–32)
Calcium: 8.9 mg/dL (ref 8.9–10.3)
Chloride: 104 mmol/L (ref 98–111)
Creatinine, Ser: 1.02 mg/dL — ABNORMAL HIGH (ref 0.44–1.00)
GFR, Estimated: >60 mL/min (ref >60)
Glucose, Bld: 136 mg/dL — ABNORMAL HIGH (ref 70–99)
Potassium: 4.4 mmol/L (ref 3.5–5.1)
Sodium: 137 mmol/L (ref 135–145)
Total Bilirubin: 0.4 mg/dL (ref 0.0–1.2)
Total Protein: 7.1 g/dL (ref 6.5–8.1)

## 2024-02-25 LAB — CBC WITH DIFFERENTIAL/PLATELET
Abs Immature Granulocytes: 0.01 K/uL (ref 0.00–0.07)
Basophils Absolute: 0 K/uL (ref 0.0–0.1)
Basophils Relative: 1 %
Eosinophils Absolute: 0.1 K/uL (ref 0.0–0.5)
Eosinophils Relative: 2 %
HCT: 33.6 % — ABNORMAL LOW (ref 36.0–46.0)
Hemoglobin: 10.8 g/dL — ABNORMAL LOW (ref 12.0–15.0)
Immature Granulocytes: 0 %
Lymphocytes Relative: 33 %
Lymphs Abs: 1.5 K/uL (ref 0.7–4.0)
MCH: 26.2 pg (ref 26.0–34.0)
MCHC: 32.1 g/dL (ref 30.0–36.0)
MCV: 81.6 fL (ref 80.0–100.0)
Monocytes Absolute: 0.6 K/uL (ref 0.1–1.0)
Monocytes Relative: 12 %
Neutro Abs: 2.3 K/uL (ref 1.7–7.7)
Neutrophils Relative %: 52 %
Platelets: 275 K/uL (ref 150–400)
RBC: 4.12 MIL/uL (ref 3.87–5.11)
RDW: 12.9 % (ref 11.5–15.5)
Smear Review: NORMAL
WBC: 4.5 K/uL (ref 4.0–10.5)
nRBC: 0 % (ref 0.0–0.2)

## 2024-02-25 LAB — PHOSPHORUS: Phosphorus: 3.3 mg/dL (ref 2.5–4.6)

## 2024-02-25 MED ORDER — FOLIC ACID 1 MG PO TABS
1.0000 mg | ORAL_TABLET | Freq: Every day | ORAL | 0 refills | Status: AC
  Filled 2024-02-25: qty 30, 30d supply, fill #0

## 2024-02-25 MED ORDER — METRONIDAZOLE 500 MG PO TABS
500.0000 mg | ORAL_TABLET | Freq: Two times a day (BID) | ORAL | 0 refills | Status: AC
  Filled 2024-02-25: qty 10, 5d supply, fill #0

## 2024-02-25 MED ORDER — THIAMINE HCL 100 MG PO TABS
100.0000 mg | ORAL_TABLET | Freq: Every day | ORAL | 0 refills | Status: AC
  Filled 2024-02-25: qty 30, 30d supply, fill #0

## 2024-02-25 NOTE — Discharge Summary (Signed)
 Sylvia Arellano FMW:[id number] DOB: [birth year] DOA: 02/19/2024  PCP: Patient, No Pcp Per  Admit date: 02/19/2024  Discharge date: 02/25/2024  Admitted From: Home   Disposition:  Home   Recommendations for Outpatient Follow-up: Kindly arrange for close outpatient follow-up with GI and GYN in 1 to 2 weeks.  Follow up with PCP in 1-2 weeks  PCP Please obtain BMP/CBC, 2 view CXR in 1week,  (see Discharge instructions)   PCP Please follow up on the following pending results:    Home Health: None   Equipment/Devices: None  Consultations: None  Discharge Condition: Stable    CODE STATUS: Full    Diet Recommendation: Heart Healthy Low Carb     Chief Complaint  Patient presents with   Fatigue   Decreased Appetite     Brief history of present illness from the day of admission and additional interim summary    53 y.o.  female with history of EtOH/cocaine use-presented with upper abdominal pain-found to have acute pancreatitis.   Significant events: 10/2>> admit to [facility]   Significant studies: 10/1>> RUQ ultrasound: No acute abnormalities-no cholelithiasis 10/1>> CT abdomen/pelvis: No acute findings-fibroid uterus. 10/1>> lipase: 638 10/2>> triglycerides: 42                                                                 Hospital Course   Acute alcoholic pancreatitis No cholelithiasis seen on imaging studies Triglycerides within normal limits Treated with supportive care now acute pancreatitis has resolved tolerating diet eager to go home, counseled to abstain from alcohol, requested to follow-up with GI outpatient and maintain a low carbohydrate heart healthy diet.  Follow-up with PCP in 3 to 4 weeks   EtOH abuse He had no signs of DTs, counseled to quit from both.  Does not desire any referral for outpatient  detox resources.   Bacterial vaginitis with positive clue cells in the specimen obtained in the ER Flagyl 500 twice daily x 7 days, HIV negative, acute hepatitis panel negative, request PCP to arrange for one-time outpatient GYN follow-up.   Normocytic anemia Probably secondary to acute illness No indication of blood loss Anemia is mild-stable for follow-up.   History of depression/anxiety Appears stable Continue fluoxetine /trazodone  As needed Vistaril  for anxiety   History of TIA Appears stable-no recurrence, follow-up with PCP.   Discharge diagnosis     Principal Problem:   Acute pancreatitis Active Problems:   TIA (transient ischemic attack)   Alcohol use disorder in remission   Cocaine use disorder in remission   MDD (major depressive disorder), recurrent episode, moderate (HCC)   Pancreatitis    Discharge instructions    Discharge Instructions     Diet - low sodium heart healthy   Complete by: As directed    Discharge instructions   Complete by:  As directed    Follow with Primary MD  in  3 days, follow-up with the recommended gastroenterologist in 1 to 2 weeks.  Follow-up with your GYN physician within 1 to 2 weeks, if needed get a referral from your PCP.  Get CBC, CMP, Magnesium , 2 view Chest X ray -  checked next visit with your primary MD   Activity: As tolerated with Full fall precautions use walker/cane & assistance as needed  Disposition Home    Diet: Heart Healthy low carbohydrate diet.  Special Instructions: If you have smoked or chewed Tobacco  in the last 2 yrs please stop smoking, stop any regular Alcohol  and or any Recreational drug use.  On your next visit with your primary care physician please Get Medicines reviewed and adjusted.  Please request your Prim.MD to go over all Hospital Tests and Procedure/Radiological results at the follow up, please get all Hospital records sent to your Prim MD by signing hospital release before you go  home.  If you experience worsening of your admission symptoms, develop shortness of breath, life threatening emergency, suicidal or homicidal thoughts you must seek medical attention immediately by calling 911 or calling your MD immediately  if symptoms less severe.  You Must read complete instructions/literature along with all the possible adverse reactions/side effects for all the Medicines you take and that have been prescribed to you. Take any new Medicines after you have completely understood and accpet all the possible adverse reactions/side effects.   Do not drive when taking Pain medications.  Do not take more than prescribed Pain, Sleep and Anxiety Medications  Wear Seat belts while driving.   Increase activity slowly   Complete by: As directed        Discharge Medications   Allergies as of 02/25/2024   No Known Allergies      Medication List     STOP taking these medications    ibuprofen  200 MG tablet Commonly known as: ADVIL        TAKE these medications    FLUoxetine  20 MG capsule Commonly known as: PROZAC  Take 1 capsule (20 mg total) by mouth in the morning.   folic acid  1 MG tablet Commonly known as: FOLVITE  Take 1 tablet (1 mg total) by mouth daily.   hydrOXYzine  25 MG tablet Commonly known as: ATARAX  May take 1 tablet (25 mg total) by mouth 3 (three) times daily as needed for anxiety. May also take 1-2 tablets (25-50 mg total) at bedtime as needed for sleep.   metroNIDAZOLE 500 MG tablet Commonly known as: FLAGYL Take 1 tablet (500 mg total) by mouth every 12 (twelve) hours.   thiamine  100 MG tablet Commonly known as: Vitamin B-1 Take 1 tablet (100 mg total) by mouth daily.   traZODone  50 MG tablet Commonly known as: DESYREL  Take 0.5-1 tablets (25-50 mg total) by mouth at bedtime as needed for sleep. What changed:  how much to take when to take this         Follow-up Information     [facility] Follow up on 02/28/2024.    Why: Your appointment is at 1:40 pm. please arrive early and bring a picture ID, current medications and insurance card Contact information: [address], [place], [place] [address]  Phone: [phone]        Elicia Claw, MD. Schedule an appointment as soon as possible for a visit in 1 week(s).   Specialty: Gastroenterology Contact information: [address]  [address] [place] [address] [phone]                 Major procedures and Radiology Reports - PLEASE review detailed and final reports thoroughly  -      CT ABDOMEN PELVIS W CONTRAST Result Date: 02/20/2024 EXAM: CT ABDOMEN AND PELVIS WITH CONTRAST 02/20/2024 03:36:20 AM TECHNIQUE: CT of the abdomen and pelvis was performed with the administration of 75 mL iohexol (OMNIPAQUE) 350 MG/ML injection. Multiplanar reformatted images are provided for review. Automated exposure control, iterative reconstruction, and/or weight-based adjustment of the mA/kV was utilized to reduce the radiation dose to as low as reasonably achievable. COMPARISON: None available. CLINICAL HISTORY: Pancreatitis, acute, severe. Patient reports fatigue, abdominal pain, and decreased appetite since Sunday. Patient is alert and oriented x4. FINDINGS: LOWER CHEST: No acute abnormality. LIVER: The liver is unremarkable. GALLBLADDER AND BILE DUCTS: Gallbladder is unremarkable. No biliary ductal dilatation. SPLEEN: No acute abnormality. PANCREAS: No acute abnormality. ADRENAL GLANDS: No acute abnormality. KIDNEYS, URETERS AND BLADDER: No stones in the kidneys or ureters. No hydronephrosis. No perinephric or periureteral stranding. Urinary bladder is unremarkable. GI AND BOWEL: Stomach demonstrates no acute abnormality. There is no bowel obstruction. Normal appendix. PERITONEUM AND RETROPERITONEUM: Small amount of free fluid in the pelvis. No free air. VASCULATURE: Aorta is normal in caliber. LYMPH NODES: No lymphadenopathy. REPRODUCTIVE  ORGANS: Multiple fibroids in the uterus. The largest is in the uterine fundus and measures approximately 4.4 cm. BONES AND SOFT TISSUES: No acute osseous abnormality. No focal soft tissue abnormality. IMPRESSION: 1. No acute findings in the abdomen or pelvis. 2. Fibroid uterus. Electronically signed by: Norman Gatlin MD 02/20/2024 03:43 AM EDT RP Workstation: [id number]   US  Abdomen Limited RUQ (LIVER/GB) Result Date: 02/20/2024 EXAM: Right Upper Quadrant Abdominal Ultrasound 02/20/2024 01:42:42 AM TECHNIQUE: Real-time ultrasonography of the right upper quadrant of the abdomen was performed. COMPARISON: None available. CLINICAL HISTORY: RUQ pain 151471. RUQ pain. FINDINGS: LIVER: The liver demonstrates normal echogenicity. No intrahepatic biliary ductal dilatation. No evidence of mass. Patent portal vein with antegrade flow. BILIARY SYSTEM: No pericholecystic fluid or wall thickening. No cholelithiasis. Negative sonographic Murphy's sign. Common bile duct is within normal limits measuring 5 mm. OTHER: No right upper quadrant ascites. IMPRESSION: 1. No acute abnormalities. Electronically signed by: Norman Gatlin MD 02/20/2024 01:54 AM EDT RP Workstation: [id number]    Micro Results    Recent Results (from the past 240 hours)  Wet prep, genital     Status: Abnormal   Collection Time: 02/22/24  6:13 AM   Specimen: Vaginal  Result Value Ref Range Status   Yeast Wet Prep HPF POC NONE SEEN NONE SEEN Final   Trich, Wet Prep PRESENT (A) NONE SEEN Final   Clue Cells Wet Prep HPF POC NONE SEEN NONE SEEN Final   WBC, Wet Prep HPF POC <10 <10 Final   Sperm NONE SEEN  Final    Comment: Performed at [facility], [address] [place], [place] [address]    Today   Subjective    Sylvia Arellano today has no headache,no chest abdominal pain,no new weakness tingling or numbness, feels much better wants to go home today.    Objective   Blood pressure 103/84, pulse 70, temperature 98 F (36.7 C),  temperature source Oral, resp. rate 17, height 5' 2 (1.575 m), weight 58.8 kg, SpO2 96%.  No intake or output data in the 24 hours ending 02/25/24 0804  Exam  Awake Alert, No new  F.N deficits,    [place].AT,PERRAL Supple Neck,   Symmetrical Chest wall movement, Good air movement bilaterally, CTAB RRR,No Gallops,   +ve B.Sounds, Abd Soft, Non tender,  No Cyanosis, Clubbing or edema    Data Review   Recent Labs  Lab 02/20/24 1004 02/21/24 0302 02/22/24 0550 02/23/24 0155 02/24/24 0350 02/25/24 0359  WBC 4.0 5.1 5.0 4.5 4.4 4.5  HGB 9.3* 9.2* 10.7* 9.8* 9.8* 10.8*  HCT 29.0* 28.3* 34.4* 30.2* 30.0* 33.6*  PLT 187 230 232 217 226 275  MCV 82.6 81.3 86.0 81.8 81.7 81.6  MCH 26.5 26.4 26.8 26.6 26.7 26.2  MCHC 32.1 32.5 31.1 32.5 32.7 32.1  RDW 13.1 13.1 12.7 12.5 12.7 12.9  LYMPHSABS 1.6  --  1.2 1.3 1.6 1.5  MONOABS 0.1  --  0.4 0.5 0.2 0.6  EOSABS 0.1  --  0.1 0.1 0.1 0.1  BASOSABS 0.0  --  0.1 0.0 0.0 0.0    Recent Labs  Lab 02/21/24 0302 02/22/24 0550 02/23/24 0155 02/24/24 0350 02/25/24 0359  NA 138 137 137 136 137  K 4.0 4.1 4.3 4.1 4.4  CL 107 102 104 104 104  CO2 24 22 26 24 27   ANIONGAP 7 13 7 8 6   GLUCOSE 100* 98 111* 124* 136*  BUN <5* <5* 5* 6 7  CREATININE 0.71 0.72 0.71 0.83 1.02*  AST 19 18 16 19 23   ALT 14 14 13 14 15   ALKPHOS 41 47 44 42 44  BILITOT 0.2 0.2 0.3 <0.2 0.4  ALBUMIN 2.6* 2.9* 2.6* 2.6* 3.0*  INR  --  1.0  --   --   --   MG  --  1.7 1.7 1.7 1.9  PHOS  --  4.0 3.8 3.4 3.3  CALCIUM  8.4* 9.0 8.7* 8.5* 8.9    Total Time in preparing paper work, data evaluation and todays exam - 35 minutes  Signature  -    Lavada Stank M.D on 02/25/2024 at 8:04 AM   -  To page go to [email or web address]

## 2024-02-25 NOTE — Progress Notes (Signed)
 Administered Morning medications. Reviewed AVS, patient expressed understanding of medications, MD follow up reviewed.   Removed IV, Site clean, dry and intact.  Patient states all belongings brought to the hospital at time of admission are accounted for and packed to take home.  Picked up medications from [facility] pharmacy. [facility] contacted to transport patient to  ED Dept entrance where patient drove self to transport home.

## 2024-02-25 NOTE — Plan of Care (Signed)
                                      [facility]                            [address] [place], [place] [address]      Sylvia Arellano was admitted to the Hospital on 02/19/2024 and Discharged  02/25/2024 and should be excused from work/school   for 10 days starting from date -  02/19/2024 , may return to work/school without any restrictions.  Call Lavada Stank MD, [facility]  [phone] with questions.  Lavada Stank M.D on 02/25/2024,at 5:34 AM  [facility]   Office  [phone]

## 2024-02-25 NOTE — TOC Transition Note (Signed)
 Transition of Care [facility]) - Discharge Note   Patient Details  Name: Sylvia Arellano MRN: [id number] Date of Birth: 12-May-1971  [facility] CM/SW Contact:  Andrez JULIANNA George, RN Phone Number: 02/25/2024, 8:48 AM   Clinical Narrative:     Pt is discharging home with self care. No further needs per IP Care management.   Final next level of care: Home/Self Care Barriers to Discharge: No Barriers Identified   Patient Goals and CMS Choice            Discharge Placement                       Discharge Plan and Services Additional resources added to the After Visit Summary for     Discharge Planning Services: CM Consult                                 Social Drivers of Health (SDOH) Interventions SDOH Screenings   Food Insecurity: No Food Insecurity (02/20/2024)  Housing: Low Risk  (02/20/2024)  Transportation Needs: No Transportation Needs (02/20/2024)  Utilities: Not At Risk (02/20/2024)  Alcohol Screen: Low Risk  (05/27/2020)  Depression (PHQ2-9): Medium Risk (05/26/2020)  Tobacco Use: Medium Risk (02/19/2024)     Readmission Risk Interventions     No data to display

## 2024-02-25 NOTE — Discharge Instructions (Addendum)
 Follow with Primary MD  in  3 days, follow-up with the recommended gastroenterologist in 1 to 2 weeks.  Follow-up with your GYN physician within 1 to 2 weeks, if needed get a referral from your PCP.  Get CBC, CMP, Magnesium , 2 view Chest X ray -  checked next visit with your primary MD   Activity: As tolerated with Full fall precautions use walker/cane & assistance as needed  Disposition Home    Diet: Heart Healthy low carbohydrate diet.  Special Instructions: If you have smoked or chewed Tobacco  in the last 2 yrs please stop smoking, stop any regular Alcohol  and or any Recreational drug use.  On your next visit with your primary care physician please Get Medicines reviewed and adjusted.  Please request your Prim.MD to go over all Hospital Tests and Procedure/Radiological results at the follow up, please get all Hospital records sent to your Prim MD by signing hospital release before you go home.  If you experience worsening of your admission symptoms, develop shortness of breath, life threatening emergency, suicidal or homicidal thoughts you must seek medical attention immediately by calling 911 or calling your MD immediately  if symptoms less severe.  You Must read complete instructions/literature along with all the possible adverse reactions/side effects for all the Medicines you take and that have been prescribed to you. Take any new Medicines after you have completely understood and accpet all the possible adverse reactions/side effects.   Do not drive when taking Pain medications.  Do not take more than prescribed Pain, Sleep and Anxiety Medications  Wear Seat belts while driving.

## 2024-02-25 NOTE — Progress Notes (Signed)
 Work note provided per Dr. Dennise.

## 2024-03-13 ENCOUNTER — Other Ambulatory Visit (HOSPITAL_COMMUNITY): Payer: Self-pay | Admitting: Psychiatry

## 2024-03-13 DIAGNOSIS — F411 Generalized anxiety disorder: Secondary | ICD-10-CM

## 2024-03-13 DIAGNOSIS — F4310 Post-traumatic stress disorder, unspecified: Secondary | ICD-10-CM

## 2024-03-17 NOTE — Progress Notes (Unsigned)
 Patient did not connect for virtual psychiatric medication management appointment on 03/18/24 at 4PM. Sent secure video link with no response. Called phone with no answer; left VM with callback number to reschedule.  LAURAINE DELENA PUMMEL, MD 03/18/24

## 2024-03-18 ENCOUNTER — Encounter (HOSPITAL_COMMUNITY): Payer: MEDICAID | Admitting: Psychiatry

## 2024-03-18 ENCOUNTER — Encounter (HOSPITAL_COMMUNITY): Payer: Self-pay

## 2024-04-09 ENCOUNTER — Other Ambulatory Visit (HOSPITAL_COMMUNITY): Payer: Self-pay | Admitting: Psychiatry

## 2024-04-09 DIAGNOSIS — F331 Major depressive disorder, recurrent, moderate: Secondary | ICD-10-CM

## 2024-04-09 DIAGNOSIS — F411 Generalized anxiety disorder: Secondary | ICD-10-CM

## 2024-04-09 DIAGNOSIS — F4310 Post-traumatic stress disorder, unspecified: Secondary | ICD-10-CM
